# Patient Record
Sex: Male | Born: 1970
Health system: Southern US, Community
[De-identification: ages and names within clinical notes are randomized; demographics above are authoritative.]

## PROBLEM LIST (undated history)

## (undated) DIAGNOSIS — E785 Hyperlipidemia, unspecified: Secondary | ICD-10-CM

## (undated) DIAGNOSIS — T7840XA Allergy, unspecified, initial encounter: Secondary | ICD-10-CM

## (undated) DIAGNOSIS — B019 Varicella without complication: Secondary | ICD-10-CM

## (undated) DIAGNOSIS — J302 Other seasonal allergic rhinitis: Secondary | ICD-10-CM

## (undated) HISTORY — DX: Hyperlipidemia, unspecified: E78.5

## (undated) HISTORY — PX: WISDOM TOOTH EXTRACTION: SHX21

## (undated) HISTORY — PX: FINGER SURGERY: SHX640

## (undated) HISTORY — DX: Allergy, unspecified, initial encounter: T78.40XA

## (undated) HISTORY — DX: Varicella without complication: B01.9

## (undated) HISTORY — PX: TOE SURGERY: SHX1073

## (undated) HISTORY — DX: Other seasonal allergic rhinitis: J30.2

---

## 2003-09-24 ENCOUNTER — Encounter: Admission: RE | Admit: 2003-09-24 | Discharge: 2003-09-24 | Payer: Self-pay | Admitting: Internal Medicine

## 2003-09-26 ENCOUNTER — Ambulatory Visit (HOSPITAL_COMMUNITY): Admission: RE | Admit: 2003-09-26 | Discharge: 2003-09-26 | Payer: Self-pay | Admitting: Internal Medicine

## 2004-04-13 ENCOUNTER — Ambulatory Visit: Payer: Self-pay | Admitting: Internal Medicine

## 2004-08-11 ENCOUNTER — Ambulatory Visit: Payer: Self-pay | Admitting: Internal Medicine

## 2005-08-13 ENCOUNTER — Ambulatory Visit: Payer: Self-pay | Admitting: Internal Medicine

## 2006-05-03 ENCOUNTER — Ambulatory Visit: Payer: Self-pay | Admitting: Internal Medicine

## 2006-07-12 ENCOUNTER — Ambulatory Visit: Payer: Self-pay | Admitting: Internal Medicine

## 2006-07-13 ENCOUNTER — Encounter: Admission: RE | Admit: 2006-07-13 | Discharge: 2006-07-13 | Payer: Self-pay | Admitting: Internal Medicine

## 2008-02-23 HISTORY — PX: BLEPHAROPLASTY: SUR158

## 2011-01-25 ENCOUNTER — Ambulatory Visit: Payer: Self-pay | Admitting: Internal Medicine

## 2011-01-26 ENCOUNTER — Ambulatory Visit (INDEPENDENT_AMBULATORY_CARE_PROVIDER_SITE_OTHER): Payer: Self-pay | Admitting: Internal Medicine

## 2011-01-26 ENCOUNTER — Encounter: Payer: Self-pay | Admitting: Internal Medicine

## 2011-01-26 ENCOUNTER — Other Ambulatory Visit: Payer: Self-pay | Admitting: Internal Medicine

## 2011-01-26 ENCOUNTER — Ambulatory Visit (HOSPITAL_BASED_OUTPATIENT_CLINIC_OR_DEPARTMENT_OTHER)
Admission: RE | Admit: 2011-01-26 | Discharge: 2011-01-26 | Disposition: A | Discharge status: Home / Self Care | Payer: BC Managed Care – PPO | Source: Ambulatory Visit | Attending: Internal Medicine | Admitting: Internal Medicine

## 2011-01-26 ENCOUNTER — Ambulatory Visit (INDEPENDENT_AMBULATORY_CARE_PROVIDER_SITE_OTHER)
Admission: RE | Admit: 2011-01-26 | Discharge: 2011-01-26 | Disposition: A | Discharge status: Home / Self Care | Payer: BC Managed Care – PPO | Source: Ambulatory Visit | Attending: Internal Medicine | Admitting: Internal Medicine

## 2011-01-26 DIAGNOSIS — N5089 Other specified disorders of the male genital organs: Secondary | ICD-10-CM

## 2011-01-26 DIAGNOSIS — R221 Localized swelling, mass and lump, neck: Secondary | ICD-10-CM

## 2011-01-26 DIAGNOSIS — N433 Hydrocele, unspecified: Secondary | ICD-10-CM | POA: Insufficient documentation

## 2011-01-26 DIAGNOSIS — N508 Other specified disorders of male genital organs: Secondary | ICD-10-CM

## 2011-01-26 LAB — BASIC METABOLIC PANEL
BUN: 12 mg/dL (ref 6–23)
CO2: 28 mEq/L (ref 19–32)
Calcium: 9.3 mg/dL (ref 8.4–10.5)
Creat: 0.76 mg/dL (ref 0.50–1.35)
Glucose, Bld: 96 mg/dL (ref 70–99)
Sodium: 139 mEq/L (ref 135–145)

## 2011-01-26 LAB — CBC WITH DIFFERENTIAL/PLATELET
MCHC: 33.6 g/dL (ref 30.0–36.0)
MCV: 89.2 fL (ref 78.0–100.0)
Monocytes Absolute: 0.5 10*3/uL (ref 0.1–1.0)
Neutro Abs: 2.3 10*3/uL (ref 1.7–7.7)
Neutrophils Relative %: 43 % (ref 43–77)
Platelets: 195 10*3/uL (ref 150–400)
RBC: 5.27 MIL/uL (ref 4.22–5.81)
RDW: 12.5 % (ref 11.5–15.5)
WBC: 5.4 10*3/uL (ref 4.0–10.5)

## 2011-01-26 LAB — TSH: TSH: 2.415 u[IU]/mL (ref 0.350–4.500)

## 2011-01-26 MED ORDER — LEVOFLOXACIN 500 MG PO TABS: 500.0000 mg | ORAL_TABLET | Freq: Every day | ORAL | Status: AC

## 2011-01-26 NOTE — Progress Notes (Signed)
  Subjective:    Patient ID: Gary Booth, male    DOB: [birth year], 40 y.o.   MRN: [id number]  HPI Pt presents to clinic for evaluation of scrotal pain and neck mass. Notes left ant neck st mass duration ~33months. No change in size or associated pain. No fever, chills, sweats or weight loss. Also 6 month h/o right testicular pain intermittently for 6 months. Notes on self exam area of prominence along superior aspect of right scrotum. No urinary sx's, dysuria, hematuria or urethral discharge. No alleviating or exacerbating factors. No other complaints.  No past medical history on file. Denies Past Surgical History  Procedure Date  . Blepharoplasty 2010    left eye-lazy eye lid    reports that he has quit smoking. He has quit using smokeless tobacco. His smokeless tobacco use included Chew. He reports that he drinks alcohol. He reports that he does not use illicit drugs. family history includes Arthritis in his mother and paternal grandmother and Prostate cancer in his father. No Known Allergies   Review of Systems  Constitutional: Negative for fever, chills, fatigue and unexpected weight change.  HENT: Negative for neck pain.   Genitourinary: Positive for scrotal swelling and testicular pain. Negative for dysuria, urgency, hematuria and discharge.  All other systems reviewed and are negative.       Objective:   Physical Exam  Nursing note and vitals reviewed. Constitutional: He appears well-developed and well-nourished. No distress.  HENT:  Head: Normocephalic and atraumatic.  Right Ear: External ear normal.  Left Ear: External ear normal.  Nose: Nose normal.  Mouth/Throat: Oropharynx is clear and moist. No oropharyngeal exudate.  Eyes: Conjunctivae are normal. Right eye exhibits no discharge. Left eye exhibits no discharge. No scleral icterus.  Neck: Neck supple.       Left anterior neck- well circumscribed ST mass. Mobile. Nontender. ~1cm  Genitourinary: Penis normal.     Bilaterally descended testes. Testes NT. ST prominence above right testicle  Neurological: He is alert.  Skin: Skin is warm and dry. No rash noted. He is not diaphoretic. No erythema.  Psychiatric: He has a normal mood and affect.          Assessment & Plan:

## 2011-01-27 LAB — URINALYSIS, ROUTINE W REFLEX MICROSCOPIC
Leukocytes, UA: NEGATIVE
Nitrite: NEGATIVE
Protein, ur: NEGATIVE mg/dL
Specific Gravity, Urine: 1.02 (ref 1.005–1.030)
Urobilinogen, UA: 0.2 mg/dL (ref 0.0–1.0)

## 2011-01-27 LAB — SEDIMENTATION RATE: Sed Rate: 1 mm/hr (ref 0–16)

## 2011-01-30 DIAGNOSIS — N5089 Other specified disorders of the male genital organs: Secondary | ICD-10-CM | POA: Insufficient documentation

## 2011-01-30 DIAGNOSIS — R221 Localized swelling, mass and lump, neck: Secondary | ICD-10-CM | POA: Insufficient documentation

## 2011-01-30 NOTE — Assessment & Plan Note (Signed)
Obtain cbc, chem7, esr. Empiric abx trial and close followup. If persists consider ent consult

## 2011-01-30 NOTE — Assessment & Plan Note (Signed)
Obtain ua. Schedule scrotal US

## 2011-02-09 ENCOUNTER — Ambulatory Visit (INDEPENDENT_AMBULATORY_CARE_PROVIDER_SITE_OTHER): Payer: BC Managed Care – PPO | Admitting: Internal Medicine

## 2011-02-09 ENCOUNTER — Encounter: Payer: Self-pay | Admitting: Internal Medicine

## 2011-02-09 VITALS — BP 120/80 | HR 59 | Temp 97.5°F | Resp 16 | Wt 220.0 lb

## 2011-02-09 DIAGNOSIS — N508 Other specified disorders of male genital organs: Secondary | ICD-10-CM

## 2011-02-09 DIAGNOSIS — R221 Localized swelling, mass and lump, neck: Secondary | ICD-10-CM

## 2011-02-09 DIAGNOSIS — R220 Localized swelling, mass and lump, head: Secondary | ICD-10-CM

## 2011-02-09 DIAGNOSIS — N5089 Other specified disorders of the male genital organs: Secondary | ICD-10-CM

## 2011-02-09 NOTE — Progress Notes (Signed)
  Subjective:    Patient ID: Gary Booth, male    DOB: November 11, 1970, 40 y.o.   MRN: [id number]  HPI Pt presents to clinic for followup of multiple medical problems. Previous scrotal tenderness now resolved s/p course of levaquin. Reviewed scrotal US demonstrating bilateral small epididymal cysts and hydroceles. Left anterior neck soft tissue mass remains present without change in size. Area is nontender. Reviewed nl laboratory evaluation.  No alleviating or exacerbating factors. No other complaints.  No past medical history on file. Past Surgical History  Procedure Date  . Blepharoplasty 2010    left eye-lazy eye lid    reports that he has quit smoking. He has quit using smokeless tobacco. His smokeless tobacco use included Chew. He reports that he drinks alcohol. He reports that he does not use illicit drugs. family history includes Arthritis in his mother and paternal grandmother and Prostate cancer in his father. No Known Allergies    Review of Systems see hpi     Objective:   Physical Exam  Nursing note and vitals reviewed. Constitutional: He appears well-developed and well-nourished. No distress.  HENT:  Head: Normocephalic and atraumatic.  Right Ear: External ear normal.  Left Ear: External ear normal.  Eyes: Conjunctivae are normal. No scleral icterus.  Neck: Neck supple.       Left anterior neck lower aspect: ST mass stable without change in size. NT and mobile. ?slightly elongated.  Neurological: He is alert.  Skin: Skin is warm and dry. He is not diaphoretic.  Psychiatric: He has a normal mood and affect.          Assessment & Plan:

## 2011-02-09 NOTE — Assessment & Plan Note (Signed)
Pain resolved s/p abx course. ?epididymitis. Reviewed benign US findings.

## 2011-02-09 NOTE — Assessment & Plan Note (Signed)
Persistent without change s/p abx course. Recommend ENT consult for further evaluation.

## 2013-07-11 ENCOUNTER — Ambulatory Visit (INDEPENDENT_AMBULATORY_CARE_PROVIDER_SITE_OTHER): Payer: Managed Care, Other (non HMO) | Admitting: Physician Assistant

## 2013-07-11 ENCOUNTER — Encounter: Payer: Self-pay | Admitting: Physician Assistant

## 2013-07-11 VITALS — BP 138/82 | HR 66 | Temp 98.8°F | Resp 16 | Ht 69.25 in | Wt 221.5 lb

## 2013-07-11 DIAGNOSIS — J309 Allergic rhinitis, unspecified: Secondary | ICD-10-CM

## 2013-07-11 DIAGNOSIS — Z23 Encounter for immunization: Secondary | ICD-10-CM

## 2013-07-11 DIAGNOSIS — Z0000 Encounter for general adult medical examination without abnormal findings: Secondary | ICD-10-CM

## 2013-07-11 DIAGNOSIS — J302 Other seasonal allergic rhinitis: Secondary | ICD-10-CM | POA: Insufficient documentation

## 2013-07-11 NOTE — Assessment & Plan Note (Signed)
Medical history reviewed and updated.  Physical exam without abnormality. Will obtain fasting labs.  Patient due for TDaP.  Immunization given by nursing staff.

## 2013-07-11 NOTE — Progress Notes (Signed)
Pre visit review using our clinic review tool, if applicable. No additional management support is needed unless otherwise documented below in the visit note/SLS  

## 2013-07-11 NOTE — Progress Notes (Signed)
Patient presents to clinic today for annual exam.  Patient is fasting for labs.  Patient is transferring care from Dr. Charlynn Courthomas [place], as he is no longer here at [facility].  Acute Concerns: Seasonal Allergies -- Claritin daily.    Chronic Issues: Neck Mass -- has been evaluated by ENT.  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   Health Maintenance: Dental -- Up-to-date Vision -- Up-to-date Immunizations -- Due for Tetanus.  Will get today.  Past Medical History  Diagnosis Date  . Chicken pox   . Seasonal allergies     Past Surgical History  Procedure Laterality Date  . Blepharoplasty  2010    left eye-lazy eye lid  . Wisdom tooth extraction    . Toe surgery      Right 2nd - bone spur  . Finger surgery      Dislocation 4th - Left    Current Outpatient Prescriptions on File Prior to Visit  Medication Sig Dispense Refill  . Loratadine (CLARITIN PO) Take 10 mg by mouth as needed (Seasonal Allergies).        No current facility-administered medications on file prior to visit.   No Known Allergies  Family History  Problem Relation Age of Onset  . Arthritis Mother     Living  . Arthritis Paternal Grandmother   . Prostate cancer Father     Living  . Alzheimer's disease Paternal Grandfather   . Deep vein thrombosis Maternal Grandmother   . Stroke Maternal Grandmother   . Irritable bowel syndrome Brother   . Crohn's disease Brother   . Healthy Daughter     History   Social History  . Marital Status: Married    Spouse Name: N/A    Number of Children: N/A  . Years of Education: N/A   Occupational History  . Not on file.   Social History Main Topics  . Smoking status: Former Smoker -- 0.25 packs/day for 1 years    Quit date: 07/11/1988  . Smokeless tobacco: Former NeurosurgeonUser    Types: Chew     Comment: 2. 5 years  . Alcohol Use: Yes     Comment: occasional  . Drug Use: No  . Sexual Activity: Not on file   Other Topics Concern  . Not on file   Social  History Narrative  . No narrative on file   Review of Systems  Constitutional: Negative for fever and weight loss.  HENT: Negative for ear discharge, ear pain, hearing loss and tinnitus.   Eyes: Negative for blurred vision, double vision, photophobia, pain and discharge.  Respiratory: Negative for cough and shortness of breath.   Cardiovascular: Negative for chest pain and palpitations.  Gastrointestinal: Negative for heartburn, nausea, vomiting, abdominal pain, diarrhea, constipation, blood in stool and melena.  Genitourinary: Negative for dysuria, urgency, frequency, hematuria and flank pain.       Nocturia x 0.  Denies concern for ED.  Musculoskeletal: Negative for falls and joint pain.  Skin: Negative for rash.  Neurological: Negative for dizziness, loss of consciousness and headaches.  Endo/Heme/Allergies: Positive for environmental allergies.  Psychiatric/Behavioral: Negative for depression, suicidal ideas, hallucinations and substance abuse. The patient is not nervous/anxious and does not have insomnia.    BP 138/82  Pulse 66  Temp(Src) 98.8 F (37.1 C) (Oral)  Resp 16  Ht 5' 9.25" (1.759 m)  Wt 221 lb 8 oz (100.472 kg)  BMI 32.47 kg/m2  SpO2 97%  Physical Exam  Vitals reviewed. Constitutional: He  is oriented to person, place, and time and well-developed, well-nourished, and in no distress.  HENT:  Head: Normocephalic and atraumatic.  Right Ear: External ear normal.  Left Ear: External ear normal.  Nose: Nose normal.  Mouth/Throat: Oropharynx is clear and moist. No oropharyngeal exudate.  TM within normal limits bilaterally.   Eyes: Conjunctivae and EOM are normal. Pupils are equal, round, and reactive to light.  Neck: Normal range of motion. Neck supple. No thyromegaly present.  Cardiovascular: Normal rate, regular rhythm, normal heart sounds and intact distal pulses.   Pulmonary/Chest: Effort normal and breath sounds normal. No respiratory distress. He has no  wheezes. He has no rales. He exhibits no tenderness.  Abdominal: Soft. Bowel sounds are normal. He exhibits no distension and no mass. There is no tenderness. There is no rebound and no guarding.  Musculoskeletal: Normal range of motion.  Lymphadenopathy:    He has no cervical adenopathy.  Neurological: He is alert and oriented to person, place, and time. No cranial nerve deficit.  Skin: Skin is warm and dry. No rash noted.  Psychiatric: Affect normal.   Assessment/Plan: Seasonal allergies Continue claritin daily.  Visit for preventive health examination Medical history reviewed and updated.  Physical exam without abnormality. Will obtain fasting labs.  Patient due for TDaP.  Immunization given by nursing staff.

## 2013-07-11 NOTE — Assessment & Plan Note (Signed)
Continue claritin daily 

## 2013-07-11 NOTE — Patient Instructions (Signed)
Please return fasting for labs.  I will call you with your results. Please read information below on Preventive Heatlh Care for men.  Please return yearly for annual exam.  Return sooner as needed for acute visits.     Preventive Care for Adults, Male A healthy lifestyle and preventive care can promote health and wellness. Preventive health guidelines for men include the following key practices:  A routine yearly physical is a good way to check with your health care provider about your health and preventative screening. It is a chance to share any concerns and updates on your health and to receive a thorough exam.  Visit your dentist for a routine exam and preventative care every 6 months. Brush your teeth twice a day and floss once a day. Good oral hygiene prevents tooth decay and gum disease.  The frequency of eye exams is based on your age, health, family medical history, use of contact lenses, and other factors. Follow your health care provider's recommendations for frequency of eye exams.  Eat a healthy diet. Foods such as vegetables, fruits, whole grains, low-fat dairy products, and lean protein foods contain the nutrients you need without too many calories. Decrease your intake of foods high in solid fats, added sugars, and salt. Eat the right amount of calories for you.Get information about a proper diet from your health care provider, if necessary.  Regular physical exercise is one of the most important things you can do for your health. Most adults should get at least 150 minutes of moderate-intensity exercise (any activity that increases your heart rate and causes you to sweat) each week. In addition, most adults need muscle-strengthening exercises on 2 or more days a week.  Maintain a healthy weight. The body mass index (BMI) is a screening tool to identify possible weight problems. It provides an estimate of body fat based on height and weight. Your health care provider can find your BMI  and can help you achieve or maintain a healthy weight.For adults 20 years and older:  A BMI below 18.5 is considered underweight.  A BMI of 18.5 to 24.9 is normal.  A BMI of 25 to 29.9 is considered overweight.  A BMI of 30 and above is considered obese.  Maintain normal blood lipids and cholesterol levels by exercising and minimizing your intake of saturated fat. Eat a balanced diet with plenty of fruit and vegetables. Blood tests for lipids and cholesterol should begin at age 34 and be repeated every 5 years. If your lipid or cholesterol levels are high, you are over 50, or you are at high risk for heart disease, you may need your cholesterol levels checked more frequently.Ongoing high lipid and cholesterol levels should be treated with medicines if diet and exercise are not working.  If you smoke, find out from your health care provider how to quit. If you do not use tobacco, do not start.  Lung cancer screening is recommended for adults aged 88 80 years who are at high risk for developing lung cancer because of a history of smoking. A yearly low-dose CT scan of the lungs is recommended for people who have at least a 30-pack-year history of smoking and are a current smoker or have quit within the past 15 years. A pack year of smoking is smoking an average of 1 pack of cigarettes a day for 1 year (for example: 1 pack a day for 30 years or 2 packs a day for 15 years). Yearly screening should continue  until the smoker has stopped smoking for at least 15 years. Yearly screening should be stopped for people who develop a health problem that would prevent them from having lung cancer treatment.  If you choose to drink alcohol, do not have more than 2 drinks per day. One drink is considered to be 12 ounces (355 mL) of beer, 5 ounces (148 mL) of wine, or 1.5 ounces (44 mL) of liquor.  Avoid use of street drugs. Do not share needles with anyone. Ask for help if you need support or instructions about  stopping the use of drugs.  High blood pressure causes heart disease and increases the risk of stroke. Your blood pressure should be checked at least every 1 2 years. Ongoing high blood pressure should be treated with medicines, if weight loss and exercise are not effective.  If you are 79 44 years old, ask your health care provider if you should take aspirin to prevent heart disease.  Diabetes screening involves taking a blood sample to check your fasting blood sugar level. This should be done once every 3 years, after age 66, if you are within normal weight and without risk factors for diabetes. Testing should be considered at a younger age or be carried out more frequently if you are overweight and have at least 1 risk factor for diabetes.  Colorectal cancer can be detected and often prevented. Most routine colorectal cancer screening begins at the age of 29 and continues through age 71. However, your health care provider may recommend screening at an earlier age if you have risk factors for colon cancer. On a yearly basis, your health care provider may provide home test kits to check for hidden blood in the stool. Use of a small camera at the end of a tube to directly examine the colon (sigmoidoscopy or colonoscopy) can detect the earliest forms of colorectal cancer. Talk to your health care provider about this at age 69, when routine screening begins. Direct exam of the colon should be repeated every 5 10 years through age 58, unless early forms of precancerous polyps or small growths are found.  People who are at an increased risk for hepatitis B should be screened for this virus. You are considered at high risk for hepatitis B if:  You were born in a country where hepatitis B occurs often. Talk with your health care provider about which countries are considered high-risk.  Your parents were born in a high-risk country and you have not received a shot to protect against hepatitis B (hepatitis B  vaccine).  You have HIV or AIDS.  You use needles to inject street drugs.  You live with, or have sex with, someone who has hepatitis B.  You are a man who has sex with other men (MSM).  You get hemodialysis treatment.  You take certain medicines for conditions such as cancer, organ transplantation, and autoimmune conditions.  Hepatitis C blood testing is recommended for all people born from [birth year] through 1965 and any individual with known risks for hepatitis C.  Practice safe sex. Use condoms and avoid high-risk sexual practices to reduce the spread of sexually transmitted infections (STIs). STIs include gonorrhea, chlamydia, syphilis, trichomonas, herpes, HPV, and human immunodeficiency virus (HIV). Herpes, HIV, and HPV are viral illnesses that have no cure. They can result in disability, cancer, and death.  A one-time screening for abdominal aortic aneurysm (AAA) and surgical repair of large AAAs by ultrasound are recommended for men ages 47 to 40  years who are current or former smokers.  Healthy men should no longer receive prostate-specific antigen (PSA) blood tests as part of routine cancer screening. Talk with your health care provider about prostate cancer screening.  Testicular cancer screening is not recommended for adult males who have no symptoms. Screening includes self-exam, a health care provider exam, and other screening tests. Consult with your health care provider about any symptoms you have or any concerns you have about testicular cancer.  Use sunscreen. Apply sunscreen liberally and repeatedly throughout the day. You should seek shade when your shadow is shorter than you. Protect yourself by wearing long sleeves, pants, a wide-brimmed hat, and sunglasses year round, whenever you are outdoors.  Once a month, do a whole-body skin exam, using a mirror to look at the skin on your back. Tell your health care provider about new moles, moles that have irregular borders, moles  that are larger than a pencil eraser, or moles that have changed in shape or color.  Stay current with required vaccines (immunizations).  Influenza vaccine. All adults should be immunized every year.  Tetanus, diphtheria, and acellular pertussis (Td, Tdap) vaccine. An adult who has not previously received Tdap or who does not know his vaccine status should receive 1 dose of Tdap. This initial dose should be followed by tetanus and diphtheria toxoids (Td) booster doses every 10 years. Adults with an unknown or incomplete history of completing a 3-dose immunization series with Td-containing vaccines should begin or complete a primary immunization series including a Tdap dose. Adults should receive a Td booster every 10 years.  Varicella vaccine. An adult without evidence of immunity to varicella should receive 2 doses or a second dose if he has previously received 1 dose.  Human papillomavirus (HPV) vaccine. Males aged 31 21 years who have not received the vaccine previously should receive the 3-dose series. Males aged 3 26 years may be immunized. Immunization is recommended through the age of 65 years for any male who has sex with males and did not get any or all doses earlier. Immunization is recommended for any person with an immunocompromised condition through the age of 23 years if he did not get any or all doses earlier. During the 3-dose series, the second dose should be obtained 4 8 weeks after the first dose. The third dose should be obtained 24 weeks after the first dose and 16 weeks after the second dose.  Zoster vaccine. One dose is recommended for adults aged 61 years or older unless certain conditions are present.  Measles, mumps, and rubella (MMR) vaccine. Adults born before [birth year] generally are considered immune to measles and mumps. Adults born in [birth year] or later should have 1 or more doses of MMR vaccine unless there is a contraindication to the vaccine or there is laboratory evidence of  immunity to each of the three diseases. A routine second dose of MMR vaccine should be obtained at least 28 days after the first dose for students attending postsecondary schools, health care workers, or international travelers. People who received inactivated measles vaccine or an unknown type of measles vaccine during 1963 1967 should receive 2 doses of MMR vaccine. People who received inactivated mumps vaccine or an unknown type of mumps vaccine before 1979 and are at high risk for mumps infection should consider immunization with 2 doses of MMR vaccine. Unvaccinated health care workers born before 29 who lack laboratory evidence of measles, mumps, or rubella immunity or laboratory confirmation of disease should consider  measles and mumps immunization with 2 doses of MMR vaccine or rubella immunization with 1 dose of MMR vaccine.  Pneumococcal 13-valent conjugate (PCV13) vaccine. When indicated, a person who is uncertain of his immunization history and has no record of immunization should receive the PCV13 vaccine. An adult aged 70 years or older who has certain medical conditions and has not been previously immunized should receive 1 dose of PCV13 vaccine. This PCV13 should be followed with a dose of pneumococcal polysaccharide (PPSV23) vaccine. The PPSV23 vaccine dose should be obtained at least 8 weeks after the dose of PCV13 vaccine. An adult aged 76 years or older who has certain medical conditions and previously received 1 or more doses of PPSV23 vaccine should receive 1 dose of PCV13. The PCV13 vaccine dose should be obtained 1 or more years after the last PPSV23 vaccine dose.  Pneumococcal polysaccharide (PPSV23) vaccine. When PCV13 is also indicated, PCV13 should be obtained first. All adults aged 24 years and older should be immunized. An adult younger than age 78 years who has certain medical conditions should be immunized. Any person who resides in a nursing home or long-term care facility  should be immunized. An adult smoker should be immunized. People with an immunocompromised condition and certain other conditions should receive both PCV13 and PPSV23 vaccines. People with human immunodeficiency virus (HIV) infection should be immunized as soon as possible after diagnosis. Immunization during chemotherapy or radiation therapy should be avoided. Routine use of PPSV23 vaccine is not recommended for American Indians, [place] Natives, or people younger than 65 years unless there are medical conditions that require PPSV23 vaccine. When indicated, people who have unknown immunization and have no record of immunization should receive PPSV23 vaccine. One-time revaccination 5 years after the first dose of PPSV23 is recommended for people aged 64 64 years who have chronic kidney failure, nephrotic syndrome, asplenia, or immunocompromised conditions. People who received 1 2 doses of PPSV23 before age 48 years should receive another dose of PPSV23 vaccine at age 74 years or later if at least 5 years have passed since the previous dose. Doses of PPSV23 are not needed for people immunized with PPSV23 at or after age 20 years.  Meningococcal vaccine. Adults with asplenia or persistent complement component deficiencies should receive 2 doses of quadrivalent meningococcal conjugate (MenACWY-D) vaccine. The doses should be obtained at least 2 months apart. Microbiologists working with certain meningococcal bacteria, [employer] recruits, people at risk during an outbreak, and people who travel to or live in countries with a high rate of meningitis should be immunized. A first-year college student up through age 16 years who is living in a residence hall should receive a dose if he did not receive a dose on or after his 16th birthday. Adults who have certain high-risk conditions should receive one or more doses of vaccine.  Hepatitis A vaccine. Adults who wish to be protected from this disease, have certain high-risk  conditions, work with hepatitis A-infected animals, work in hepatitis A research labs, or travel to or work in countries with a high rate of hepatitis A should be immunized. Adults who were previously unvaccinated and who anticipate close contact with an international adoptee during the first 60 days after arrival in the [place] from a country with a high rate of hepatitis A should be immunized.  Hepatitis B vaccine. Adults who wish to be protected from this disease, have certain high-risk conditions, may be exposed to blood or other infectious body fluids, are household  contacts or sex partners of hepatitis B positive people, are clients or workers in certain care facilities, or travel to or work in countries with a high rate of hepatitis B should be immunized.  Haemophilus influenzae type b (Hib) vaccine. A previously unvaccinated person with asplenia or sickle cell disease or having a scheduled splenectomy should receive 1 dose of Hib vaccine. Regardless of previous immunization, a recipient of a hematopoietic stem cell transplant should receive a 3-dose series 6 12 months after his successful transplant. Hib vaccine is not recommended for adults with HIV infection. Preventive Service / Frequency Ages 62 to 62  Blood pressure check.** / Every 1 to 2 years.  Lipid and cholesterol check.** / Every 5 years beginning at age 57.  Hepatitis C blood test.** / For any individual with known risks for hepatitis C.  Skin self-exam. / Monthly.  Influenza vaccine. / Every year.  Tetanus, diphtheria, and acellular pertussis (Tdap, Td) vaccine.** / Consult your health care provider. 1 dose of Td every 10 years.  Varicella vaccine.** / Consult your health care provider.  HPV vaccine. / 3 doses over 6 months, if 34 or younger.  Measles, mumps, rubella (MMR) vaccine.** / You need at least 1 dose of MMR if you were born in 1957 or later. You may also need a second dose.  Pneumococcal 13-valent  conjugate (PCV13) vaccine.** / Consult your health care provider.  Pneumococcal polysaccharide (PPSV23) vaccine.** / 1 to 2 doses if you smoke cigarettes or if you have certain conditions.  Meningococcal vaccine.** / 1 dose if you are age 79 to 16 years and a Market researcher living in a residence hall, or have one of several medical conditions. You may also need additional booster doses.  Hepatitis A vaccine.** / Consult your health care provider.  Hepatitis B vaccine.** / Consult your health care provider.  Haemophilus influenzae type b (Hib) vaccine.** / Consult your health care provider. Ages 73 to 39  Blood pressure check.** / Every 1 to 2 years.  Lipid and cholesterol check.** / Every 5 years beginning at age 4.  Lung cancer screening. / Every year if you are aged 52 80 years and have a 30-pack-year history of smoking and currently smoke or have quit within the past 15 years. Yearly screening is stopped once you have quit smoking for at least 15 years or develop a health problem that would prevent you from having lung cancer treatment.  Fecal occult blood test (FOBT) of stool. / Every year beginning at age 63 and continuing until age 69. You may not have to do this test if you get a colonoscopy every 10 years.  Flexible sigmoidoscopy** or colonoscopy.** / Every 5 years for a flexible sigmoidoscopy or every 10 years for a colonoscopy beginning at age 60 and continuing until age 5.  Hepatitis C blood test.** / For all people born from [birth year] through 1966 and any individual with known risks for hepatitis C.  Skin self-exam. / Monthly.  Influenza vaccine. / Every year.  Tetanus, diphtheria, and acellular pertussis (Tdap/Td) vaccine.** / Consult your health care provider. 1 dose of Td every 10 years.  Varicella vaccine.** / Consult your health care provider.  Zoster vaccine.** / 1 dose for adults aged 51 years or older.  Measles, mumps, rubella (MMR) vaccine.** / You  need at least 1 dose of MMR if you were born in 1957 or later. You may also need a second dose.  Pneumococcal 13-valent conjugate (PCV13) vaccine.** / Consult  your health care provider.  Pneumococcal polysaccharide (PPSV23) vaccine.** / 1 to 2 doses if you smoke cigarettes or if you have certain conditions.  Meningococcal vaccine.** / Consult your health care provider.  Hepatitis A vaccine.** / Consult your health care provider.  Hepatitis B vaccine.** / Consult your health care provider.  Haemophilus influenzae type b (Hib) vaccine.** / Consult your health care provider. Ages 74 and over  Blood pressure check.** / Every 1 to 2 years.  Lipid and cholesterol check.**/ Every 5 years beginning at age 62.  Lung cancer screening. / Every year if you are aged 72 80 years and have a 30-pack-year history of smoking and currently smoke or have quit within the past 15 years. Yearly screening is stopped once you have quit smoking for at least 15 years or develop a health problem that would prevent you from having lung cancer treatment.  Fecal occult blood test (FOBT) of stool. / Every year beginning at age 69 and continuing until age 100. You may not have to do this test if you get a colonoscopy every 10 years.  Flexible sigmoidoscopy** or colonoscopy.** / Every 5 years for a flexible sigmoidoscopy or every 10 years for a colonoscopy beginning at age 42 and continuing until age 21.  Hepatitis C blood test.** / For all people born from [birth year] through 1966 and any individual with known risks for hepatitis C.  Abdominal aortic aneurysm (AAA) screening.** / A one-time screening for ages 59 to 77 years who are current or former smokers.  Skin self-exam. / Monthly.  Influenza vaccine. / Every year.  Tetanus, diphtheria, and acellular pertussis (Tdap/Td) vaccine.** / 1 dose of Td every 10 years.  Varicella vaccine.** / Consult your health care provider.  Zoster vaccine.** / 1 dose for adults aged 28  years or older.  Pneumococcal 13-valent conjugate (PCV13) vaccine.** / Consult your health care provider.  Pneumococcal polysaccharide (PPSV23) vaccine.** / 1 dose for all adults aged 34 years and older.  Meningococcal vaccine.** / Consult your health care provider.  Hepatitis A vaccine.** / Consult your health care provider.  Hepatitis B vaccine.** / Consult your health care provider.  Haemophilus influenzae type b (Hib) vaccine.** / Consult your health care provider. **Family history and personal history of risk and conditions may change your health care provider's recommendations. Document Released: 04/06/2001 Document Revised: 11/29/2012 Document Reviewed: 07/06/2010 [facility] Patient Information 2014 [place], [place].

## 2013-07-11 NOTE — Addendum Note (Signed)
Addended by: Regis BillSCATES, SHARON L on: 07/11/2013 06:06 PM   Modules accepted: Orders

## 2013-09-21 ENCOUNTER — Encounter: Payer: Managed Care, Other (non HMO) | Admitting: Internal Medicine

## 2014-07-15 ENCOUNTER — Encounter: Payer: Managed Care, Other (non HMO) | Admitting: Physician Assistant

## 2014-07-18 ENCOUNTER — Telehealth: Payer: Self-pay | Admitting: *Deleted

## 2014-07-18 NOTE — Telephone Encounter (Signed)
Unable to reach patient at time of Pre-Visit Call.  Left message for patient to return call when available.    

## 2014-07-19 ENCOUNTER — Encounter: Payer: Self-pay | Admitting: Physician Assistant

## 2014-07-19 ENCOUNTER — Ambulatory Visit (INDEPENDENT_AMBULATORY_CARE_PROVIDER_SITE_OTHER): Payer: Managed Care, Other (non HMO) | Admitting: Physician Assistant

## 2014-07-19 VITALS — BP 146/95 | HR 74 | Temp 97.8°F | Ht 70.0 in | Wt 223.4 lb

## 2014-07-19 DIAGNOSIS — J302 Other seasonal allergic rhinitis: Secondary | ICD-10-CM | POA: Diagnosis not present

## 2014-07-19 DIAGNOSIS — Z0000 Encounter for general adult medical examination without abnormal findings: Secondary | ICD-10-CM

## 2014-07-19 DIAGNOSIS — Z8042 Family history of malignant neoplasm of prostate: Secondary | ICD-10-CM | POA: Diagnosis not present

## 2014-07-19 LAB — URINALYSIS, ROUTINE W REFLEX MICROSCOPIC
BILIRUBIN URINE: NEGATIVE
HGB URINE DIPSTICK: NEGATIVE
Ketones, ur: NEGATIVE
LEUKOCYTES UA: NEGATIVE
Nitrite: NEGATIVE
Specific Gravity, Urine: 1.02 (ref 1.000–1.030)
TOTAL PROTEIN, URINE-UPE24: NEGATIVE
UROBILINOGEN UA: 0.2 (ref 0.0–1.0)
Urine Glucose: NEGATIVE
pH: 6 (ref 5.0–8.0)

## 2014-07-19 LAB — CBC
HEMATOCRIT: 43.9 % (ref 39.0–52.0)
HEMOGLOBIN: 15.1 g/dL (ref 13.0–17.0)
MCHC: 34.5 g/dL (ref 30.0–36.0)
MCV: 89.5 fl (ref 78.0–100.0)
PLATELETS: 273 10*3/uL (ref 150.0–400.0)
RBC: 4.9 Mil/uL (ref 4.22–5.81)
RDW: 13.1 % (ref 11.5–15.5)
WBC: 6.6 10*3/uL (ref 4.0–10.5)

## 2014-07-19 LAB — HEPATIC FUNCTION PANEL
ALK PHOS: 101 U/L (ref 39–117)
ALT: 75 U/L — ABNORMAL HIGH (ref 0–53)
AST: 68 U/L — AB (ref 0–37)
Albumin: 4.2 g/dL (ref 3.5–5.2)
BILIRUBIN DIRECT: 0.1 mg/dL (ref 0.0–0.3)
BILIRUBIN TOTAL: 0.5 mg/dL (ref 0.2–1.2)
Total Protein: 6.5 g/dL (ref 6.0–8.3)

## 2014-07-19 LAB — BASIC METABOLIC PANEL
BUN: 13 mg/dL (ref 6–23)
CALCIUM: 8.7 mg/dL (ref 8.4–10.5)
CO2: 26 mEq/L (ref 19–32)
CREATININE: 0.74 mg/dL (ref 0.40–1.50)
Chloride: 107 mEq/L (ref 96–112)
GFR: 121.52 mL/min (ref >60.00)
Glucose, Bld: 122 mg/dL — ABNORMAL HIGH (ref 70–99)
Potassium: 3.9 mEq/L (ref 3.5–5.1)
SODIUM: 139 meq/L (ref 135–145)

## 2014-07-19 LAB — LIPID PANEL
CHOLESTEROL: 119 mg/dL (ref 0–200)
HDL: 36.7 mg/dL — AB (ref >39.00)
LDL CALC: 66 mg/dL (ref 0–99)
NonHDL: 82.3
TRIGLYCERIDES: 81 mg/dL (ref 0.0–149.0)
Total CHOL/HDL Ratio: 3
VLDL: 16.2 mg/dL (ref 0.0–40.0)

## 2014-07-19 LAB — PSA: PSA: 0.36 ng/mL (ref 0.10–4.00)

## 2014-07-19 LAB — HEMOGLOBIN A1C: HEMOGLOBIN A1C: 5.8 % (ref 4.6–6.5)

## 2014-07-19 LAB — TSH: TSH: 1.75 u[IU]/mL (ref 0.35–4.50)

## 2014-07-19 NOTE — Patient Instructions (Signed)
Please go to the lab for blood work.  I will call you with your results. If all looks good we will follow-up in 1 year for your physical.  If anything is abnormal we will treat accordingly and get you back into the office for a follow-up visit.  Preventive Care for Adults A healthy lifestyle and preventive care can promote health and wellness. Preventive health guidelines for men include the following key practices:  A routine yearly physical is a good way to check with your health care provider about your health and preventative screening. It is a chance to share any concerns and updates on your health and to receive a thorough exam.  Visit your dentist for a routine exam and preventative care every 6 months. Brush your teeth twice a day and floss once a day. Good oral hygiene prevents tooth decay and gum disease.  The frequency of eye exams is based on your age, health, family medical history, use of contact lenses, and other factors. Follow your health care provider's recommendations for frequency of eye exams.  Eat a healthy diet. Foods such as vegetables, fruits, whole grains, low-fat dairy products, and lean protein foods contain the nutrients you need without too many calories. Decrease your intake of foods high in solid fats, added sugars, and salt. Eat the right amount of calories for you.Get information about a proper diet from your health care provider, if necessary.  Regular physical exercise is one of the most important things you can do for your health. Most adults should get at least 150 minutes of moderate-intensity exercise (any activity that increases your heart rate and causes you to sweat) each week. In addition, most adults need muscle-strengthening exercises on 2 or more days a week.  Maintain a healthy weight. The body mass index (BMI) is a screening tool to identify possible weight problems. It provides an estimate of body fat based on height and weight. Your health care  provider can find your BMI and can help you achieve or maintain a healthy weight.For adults 20 years and older:  A BMI below 18.5 is considered underweight.  A BMI of 18.5 to 24.9 is normal.  A BMI of 25 to 29.9 is considered overweight.  A BMI of 30 and above is considered obese.  Maintain normal blood lipids and cholesterol levels by exercising and minimizing your intake of saturated fat. Eat a balanced diet with plenty of fruit and vegetables. Blood tests for lipids and cholesterol should begin at age 34 and be repeated every 5 years. If your lipid or cholesterol levels are high, you are over 50, or you are at high risk for heart disease, you may need your cholesterol levels checked more frequently.Ongoing high lipid and cholesterol levels should be treated with medicines if diet and exercise are not working.  If you smoke, find out from your health care provider how to quit. If you do not use tobacco, do not start.  Lung cancer screening is recommended for adults aged 37-80 years who are at high risk for developing lung cancer because of a history of smoking. A yearly low-dose CT scan of the lungs is recommended for people who have at least a 30-pack-year history of smoking and are a current smoker or have quit within the past 15 years. A pack year of smoking is smoking an average of 1 pack of cigarettes a day for 1 year (for example: 1 pack a day for 30 years or 2 packs a day for  15 years). Yearly screening should continue until the smoker has stopped smoking for at least 15 years. Yearly screening should be stopped for people who develop a health problem that would prevent them from having lung cancer treatment.  If you choose to drink alcohol, do not have more than 2 drinks per day. One drink is considered to be 12 ounces (355 mL) of beer, 5 ounces (148 mL) of wine, or 1.5 ounces (44 mL) of liquor.  Avoid use of street drugs. Do not share needles with anyone. Ask for help if you need  support or instructions about stopping the use of drugs.  High blood pressure causes heart disease and increases the risk of stroke. Your blood pressure should be checked at least every 1-2 years. Ongoing high blood pressure should be treated with medicines, if weight loss and exercise are not effective.  If you are 73-64 years old, ask your health care provider if you should take aspirin to prevent heart disease.  Diabetes screening involves taking a blood sample to check your fasting blood sugar level. This should be done once every 3 years, after age 73, if you are within normal weight and without risk factors for diabetes. Testing should be considered at a younger age or be carried out more frequently if you are overweight and have at least 1 risk factor for diabetes.  Colorectal cancer can be detected and often prevented. Most routine colorectal cancer screening begins at the age of 28 and continues through age 63. However, your health care provider may recommend screening at an earlier age if you have risk factors for colon cancer. On a yearly basis, your health care provider may provide home test kits to check for hidden blood in the stool. Use of a small camera at the end of a tube to directly examine the colon (sigmoidoscopy or colonoscopy) can detect the earliest forms of colorectal cancer. Talk to your health care provider about this at age 46, when routine screening begins. Direct exam of the colon should be repeated every 5-10 years through age 81, unless early forms of precancerous polyps or small growths are found.  People who are at an increased risk for hepatitis B should be screened for this virus. You are considered at high risk for hepatitis B if:  You were born in a country where hepatitis B occurs often. Talk with your health care provider about which countries are considered high risk.  Your parents were born in a high-risk country and you have not received a shot to protect  against hepatitis B (hepatitis B vaccine).  You have HIV or AIDS.  You use needles to inject street drugs.  You live with, or have sex with, someone who has hepatitis B.  You are a man who has sex with other men (MSM).  You get hemodialysis treatment.  You take certain medicines for conditions such as cancer, organ transplantation, and autoimmune conditions.  Hepatitis C blood testing is recommended for all people born from [birth year] through 1965 and any individual with known risks for hepatitis C.  Practice safe sex. Use condoms and avoid high-risk sexual practices to reduce the spread of sexually transmitted infections (STIs). STIs include gonorrhea, chlamydia, syphilis, trichomonas, herpes, HPV, and human immunodeficiency virus (HIV). Herpes, HIV, and HPV are viral illnesses that have no cure. They can result in disability, cancer, and death.  If you are at risk of being infected with HIV, it is recommended that you take a prescription medicine daily  to prevent HIV infection. This is called preexposure prophylaxis (PrEP). You are considered at risk if:  You are a man who has sex with other men (MSM) and have other risk factors.  You are a heterosexual man, are sexually active, and are at increased risk for HIV infection.  You take drugs by injection.  You are sexually active with a partner who has HIV.  Talk with your health care provider about whether you are at high risk of being infected with HIV. If you choose to begin PrEP, you should first be tested for HIV. You should then be tested every 3 months for as long as you are taking PrEP.  A one-time screening for abdominal aortic aneurysm (AAA) and surgical repair of large AAAs by ultrasound are recommended for men ages 56 to 72 years who are current or former smokers.  Healthy men should no longer receive prostate-specific antigen (PSA) blood tests as part of routine cancer screening. Talk with your health care provider about  prostate cancer screening.  Testicular cancer screening is not recommended for adult males who have no symptoms. Screening includes self-exam, a health care provider exam, and other screening tests. Consult with your health care provider about any symptoms you have or any concerns you have about testicular cancer.  Use sunscreen. Apply sunscreen liberally and repeatedly throughout the day. You should seek shade when your shadow is shorter than you. Protect yourself by wearing long sleeves, pants, a wide-brimmed hat, and sunglasses year round, whenever you are outdoors.  Once a month, do a whole-body skin exam, using a mirror to look at the skin on your back. Tell your health care provider about new moles, moles that have irregular borders, moles that are larger than a pencil eraser, or moles that have changed in shape or color.  Stay current with required vaccines (immunizations).  Influenza vaccine. All adults should be immunized every year.  Tetanus, diphtheria, and acellular pertussis (Td, Tdap) vaccine. An adult who has not previously received Tdap or who does not know his vaccine status should receive 1 dose of Tdap. This initial dose should be followed by tetanus and diphtheria toxoids (Td) booster doses every 10 years. Adults with an unknown or incomplete history of completing a 3-dose immunization series with Td-containing vaccines should begin or complete a primary immunization series including a Tdap dose. Adults should receive a Td booster every 10 years.  Varicella vaccine. An adult without evidence of immunity to varicella should receive 2 doses or a second dose if he has previously received 1 dose.  Human papillomavirus (HPV) vaccine. Males aged 63-21 years who have not received the vaccine previously should receive the 3-dose series. Males aged 22-26 years may be immunized. Immunization is recommended through the age of 52 years for any male who has sex with males and did not get any  or all doses earlier. Immunization is recommended for any person with an immunocompromised condition through the age of 83 years if he did not get any or all doses earlier. During the 3-dose series, the second dose should be obtained 4-8 weeks after the first dose. The third dose should be obtained 24 weeks after the first dose and 16 weeks after the second dose.  Zoster vaccine. One dose is recommended for adults aged 64 years or older unless certain conditions are present.  Measles, mumps, and rubella (MMR) vaccine. Adults born before [birth year] generally are considered immune to measles and mumps. Adults born in [birth year] or later should  have 1 or more doses of MMR vaccine unless there is a contraindication to the vaccine or there is laboratory evidence of immunity to each of the three diseases. A routine second dose of MMR vaccine should be obtained at least 28 days after the first dose for students attending postsecondary schools, health care workers, or international travelers. People who received inactivated measles vaccine or an unknown type of measles vaccine during 1963-1967 should receive 2 doses of MMR vaccine. People who received inactivated mumps vaccine or an unknown type of mumps vaccine before 1979 and are at high risk for mumps infection should consider immunization with 2 doses of MMR vaccine. Unvaccinated health care workers born before [birth year] who lack laboratory evidence of measles, mumps, or rubella immunity or laboratory confirmation of disease should consider measles and mumps immunization with 2 doses of MMR vaccine or rubella immunization with 1 dose of MMR vaccine.  Pneumococcal 13-valent conjugate (PCV13) vaccine. When indicated, a person who is uncertain of his immunization history and has no record of immunization should receive the PCV13 vaccine. An adult aged 3 years or older who has certain medical conditions and has not been previously immunized should receive 1 dose of PCV13 vaccine.  This PCV13 should be followed with a dose of pneumococcal polysaccharide (PPSV23) vaccine. The PPSV23 vaccine dose should be obtained at least 8 weeks after the dose of PCV13 vaccine. An adult aged 20 years or older who has certain medical conditions and previously received 1 or more doses of PPSV23 vaccine should receive 1 dose of PCV13. The PCV13 vaccine dose should be obtained 1 or more years after the last PPSV23 vaccine dose.  Pneumococcal polysaccharide (PPSV23) vaccine. When PCV13 is also indicated, PCV13 should be obtained first. All adults aged 45 years and older should be immunized. An adult younger than age 64 years who has certain medical conditions should be immunized. Any person who resides in a nursing home or long-term care facility should be immunized. An adult smoker should be immunized. People with an immunocompromised condition and certain other conditions should receive both PCV13 and PPSV23 vaccines. People with human immunodeficiency virus (HIV) infection should be immunized as soon as possible after diagnosis. Immunization during chemotherapy or radiation therapy should be avoided. Routine use of PPSV23 vaccine is not recommended for American Indians, [place] Natives, or people younger than 65 years unless there are medical conditions that require PPSV23 vaccine. When indicated, people who have unknown immunization and have no record of immunization should receive PPSV23 vaccine. One-time revaccination 5 years after the first dose of PPSV23 is recommended for people aged 19-64 years who have chronic kidney failure, nephrotic syndrome, asplenia, or immunocompromised conditions. People who received 1-2 doses of PPSV23 before age 72 years should receive another dose of PPSV23 vaccine at age 89 years or later if at least 5 years have passed since the previous dose. Doses of PPSV23 are not needed for people immunized with PPSV23 at or after age 39 years.  Meningococcal vaccine. Adults with  asplenia or persistent complement component deficiencies should receive 2 doses of quadrivalent meningococcal conjugate (MenACWY-D) vaccine. The doses should be obtained at least 2 months apart. Microbiologists working with certain meningococcal bacteria, [employer] recruits, people at risk during an outbreak, and people who travel to or live in countries with a high rate of meningitis should be immunized. A first-year college student up through age 2 years who is living in a residence hall should receive a dose if he did not receive  a dose on or after his 16th birthday. Adults who have certain high-risk conditions should receive one or more doses of vaccine.  Hepatitis A vaccine. Adults who wish to be protected from this disease, have certain high-risk conditions, work with hepatitis A-infected animals, work in hepatitis A research labs, or travel to or work in countries with a high rate of hepatitis A should be immunized. Adults who were previously unvaccinated and who anticipate close contact with an international adoptee during the first 60 days after arrival in the [place] from a country with a high rate of hepatitis A should be immunized.  Hepatitis B vaccine. Adults should be immunized if they wish to be protected from this disease, have certain high-risk conditions, may be exposed to blood or other infectious body fluids, are household contacts or sex partners of hepatitis B positive people, are clients or workers in certain care facilities, or travel to or work in countries with a high rate of hepatitis B.  Haemophilus influenzae type b (Hib) vaccine. A previously unvaccinated person with asplenia or sickle cell disease or having a scheduled splenectomy should receive 1 dose of Hib vaccine. Regardless of previous immunization, a recipient of a hematopoietic stem cell transplant should receive a 3-dose series 6-12 months after his successful transplant. Hib vaccine is not recommended for adults  with HIV infection. Preventive Service / Frequency Ages 14 to 14  Blood pressure check.** / Every 1 to 2 years.  Lipid and cholesterol check.** / Every 5 years beginning at age 84.  Hepatitis C blood test.** / For any individual with known risks for hepatitis C.  Skin self-exam. / Monthly.  Influenza vaccine. / Every year.  Tetanus, diphtheria, and acellular pertussis (Tdap, Td) vaccine.** / Consult your health care provider. 1 dose of Td every 10 years.  Varicella vaccine.** / Consult your health care provider.  HPV vaccine. / 3 doses over 6 months, if 63 or younger.  Measles, mumps, rubella (MMR) vaccine.** / You need at least 1 dose of MMR if you were born in 1957 or later. You may also need a second dose.  Pneumococcal 13-valent conjugate (PCV13) vaccine.** / Consult your health care provider.  Pneumococcal polysaccharide (PPSV23) vaccine.** / 1 to 2 doses if you smoke cigarettes or if you have certain conditions.  Meningococcal vaccine.** / 1 dose if you are age 62 to 93 years and a Market researcher living in a residence hall, or have one of several medical conditions. You may also need additional booster doses.  Hepatitis A vaccine.** / Consult your health care provider.  Hepatitis B vaccine.** / Consult your health care provider.  Haemophilus influenzae type b (Hib) vaccine.** / Consult your health care provider. Ages 39 to 57  Blood pressure check.** / Every 1 to 2 years.  Lipid and cholesterol check.** / Every 5 years beginning at age 71.  Lung cancer screening. / Every year if you are aged 27-80 years and have a 30-pack-year history of smoking and currently smoke or have quit within the past 15 years. Yearly screening is stopped once you have quit smoking for at least 15 years or develop a health problem that would prevent you from having lung cancer treatment.  Fecal occult blood test (FOBT) of stool. / Every year beginning at age 20 and continuing until  age 70. You may not have to do this test if you get a colonoscopy every 10 years.  Flexible sigmoidoscopy** or colonoscopy.** / Every 5 years for a  flexible sigmoidoscopy or every 10 years for a colonoscopy beginning at age 54 and continuing until age 22.  Hepatitis C blood test.** / For all people born from [birth year] through 1965 and any individual with known risks for hepatitis C.  Skin self-exam. / Monthly.  Influenza vaccine. / Every year.  Tetanus, diphtheria, and acellular pertussis (Tdap/Td) vaccine.** / Consult your health care provider. 1 dose of Td every 10 years.  Varicella vaccine.** / Consult your health care provider.  Zoster vaccine.** / 1 dose for adults aged 72 years or older.  Measles, mumps, rubella (MMR) vaccine.** / You need at least 1 dose of MMR if you were born in 1957 or later. You may also need a second dose.  Pneumococcal 13-valent conjugate (PCV13) vaccine.** / Consult your health care provider.  Pneumococcal polysaccharide (PPSV23) vaccine.** / 1 to 2 doses if you smoke cigarettes or if you have certain conditions.  Meningococcal vaccine.** / Consult your health care provider.  Hepatitis A vaccine.** / Consult your health care provider.  Hepatitis B vaccine.** / Consult your health care provider.  Haemophilus influenzae type b (Hib) vaccine.** / Consult your health care provider. Ages 65 and over  Blood pressure check.** / Every 1 to 2 years.  Lipid and cholesterol check.**/ Every 5 years beginning at age 52.  Lung cancer screening. / Every year if you are aged 40-80 years and have a 30-pack-year history of smoking and currently smoke or have quit within the past 15 years. Yearly screening is stopped once you have quit smoking for at least 15 years or develop a health problem that would prevent you from having lung cancer treatment.  Fecal occult blood test (FOBT) of stool. / Every year beginning at age 61 and continuing until age 42. You may not have to  do this test if you get a colonoscopy every 10 years.  Flexible sigmoidoscopy** or colonoscopy.** / Every 5 years for a flexible sigmoidoscopy or every 10 years for a colonoscopy beginning at age 73 and continuing until age 42.  Hepatitis C blood test.** / For all people born from [birth year] through 1965 and any individual with known risks for hepatitis C.  Abdominal aortic aneurysm (AAA) screening.** / A one-time screening for ages 64 to 15 years who are current or former smokers.  Skin self-exam. / Monthly.  Influenza vaccine. / Every year.  Tetanus, diphtheria, and acellular pertussis (Tdap/Td) vaccine.** / 1 dose of Td every 10 years.  Varicella vaccine.** / Consult your health care provider.  Zoster vaccine.** / 1 dose for adults aged 84 years or older.  Pneumococcal 13-valent conjugate (PCV13) vaccine.** / Consult your health care provider.  Pneumococcal polysaccharide (PPSV23) vaccine.** / 1 dose for all adults aged 14 years and older.  Meningococcal vaccine.** / Consult your health care provider.  Hepatitis A vaccine.** / Consult your health care provider.  Hepatitis B vaccine.** / Consult your health care provider.  Haemophilus influenzae type b (Hib) vaccine.** / Consult your health care provider. **Family history and personal history of risk and conditions may change your health care provider's recommendations. Document Released: 04/06/2001 Document Revised: 02/13/2013 Document Reviewed: 07/06/2010 [facility] Patient Information 2015 [place], [place]. This information is not intended to replace advice given to you by your health care provider. Make sure you discuss any questions you have with your health care provider.

## 2014-07-19 NOTE — Assessment & Plan Note (Signed)
Well controlled. No changes. 

## 2014-07-19 NOTE — Progress Notes (Signed)
Pre visit review using our clinic review tool, if applicable. No additional management support is needed unless otherwise documented below in the visit note. 

## 2014-07-19 NOTE — Assessment & Plan Note (Signed)
Asymptomatic.  Will check PSA today after long discussion of risks and benefits of screening.

## 2014-07-19 NOTE — Progress Notes (Signed)
Patient presents to clinic today for annual exam.  Patient is fasting for labs.  Acute Concerns: No acute concerns today.  Chronic Issues: Seasonal Allergies -- still well-controlled with Claritin PRN.  No breakthrough symptoms.  Health Maintenance: Dental -- up-to-date Vision -- up-to-date Immunizations -- Tetanus up-to-date  Past Medical History  Diagnosis Date  . Chicken pox   . Seasonal allergies     Past Surgical History  Procedure Laterality Date  . Blepharoplasty  2010    left eye-lazy eye lid  . Wisdom tooth extraction    . Toe surgery      Right 2nd - bone spur  . Finger surgery      Dislocation 4th - Left    Current Outpatient Prescriptions on File Prior to Visit  Medication Sig Dispense Refill  . Loratadine (CLARITIN PO) Take 10 mg by mouth as needed (Seasonal Allergies).      No current facility-administered medications on file prior to visit.    No Known Allergies  Family History  Problem Relation Age of Onset  . Arthritis Mother     Living  . Arthritis Paternal Grandmother   . Prostate cancer Father     Living  . Alzheimer's disease Paternal Grandfather   . Deep vein thrombosis Maternal Grandmother   . Stroke Maternal Grandmother   . Irritable bowel syndrome Brother   . Crohn's disease Brother   . Healthy Daughter     History   Social History  . Marital Status: Married    Spouse Name: N/A  . Number of Children: N/A  . Years of Education: N/A   Occupational History  . Not on file.   Social History Main Topics  . Smoking status: Former Smoker -- 0.25 packs/day for 1 years    Quit date: 07/11/1988  . Smokeless tobacco: Former Neurosurgeon    Types: Chew     Comment: 2. 5 years  . Alcohol Use: 0.0 oz/week    0 Standard drinks or equivalent per week     Comment: occasional  . Drug Use: No  . Sexual Activity: Not on file   Other Topics Concern  . Not on file   Social History Narrative   Review of Systems  Constitutional:  Negative for fever and weight loss.  HENT: Negative for ear discharge, ear pain, hearing loss and tinnitus.   Eyes: Negative for blurred vision, double vision, photophobia and pain.  Respiratory: Negative for cough and shortness of breath.   Cardiovascular: Negative for chest pain and palpitations.  Gastrointestinal: Negative for heartburn, nausea, vomiting, abdominal pain, diarrhea, constipation, blood in stool and melena.  Genitourinary: Negative for dysuria, urgency, frequency, hematuria and flank pain.  Musculoskeletal: Negative for falls.  Neurological: Negative for dizziness, loss of consciousness and headaches.  Endo/Heme/Allergies: Negative for environmental allergies.  Psychiatric/Behavioral: Negative for depression, suicidal ideas, hallucinations and substance abuse. The patient is not nervous/anxious and does not have insomnia.     BP 146/95 mmHg  Pulse 74  Temp(Src) 97.8 F (36.6 C) (Oral)  Ht  (1.778 m)  Wt 223 lb 6.4 oz (101.334 kg)  BMI 32.05 kg/m2  SpO2 97%  Physical Exam  Constitutional: He is oriented to person, place, and time and well-developed, well-nourished, and in no distress.  HENT:  Head: Normocephalic and atraumatic.  Right Ear: External ear normal.  Left Ear: External ear normal.  Nose: Nose normal.  Mouth/Throat: Oropharynx is clear and moist. No oropharyngeal exudate.  Eyes: Conjunctivae and EOM  are normal. Pupils are equal, round, and reactive to light.  Neck: Neck supple. No thyromegaly present.  Cardiovascular: Normal rate, regular rhythm, normal heart sounds and intact distal pulses.   Pulmonary/Chest: Effort normal and breath sounds normal. No respiratory distress. He has no wheezes. He has no rales. He exhibits no tenderness.  Abdominal: Soft. Bowel sounds are normal. He exhibits no distension and no mass. There is no tenderness. There is no rebound and no guarding.  Genitourinary: Testes/scrotum normal.  Patient defers.  Lymphadenopathy:     He has no cervical adenopathy.  Neurological: He is alert and oriented to person, place, and time.  Skin: Skin is warm and dry. No rash noted.  Psychiatric: Affect normal.  Vitals reviewed.  Assessment/Plan: Family history of prostate cancer Asymptomatic.  Will check PSA today after long discussion of risks and benefits of screening.   Seasonal allergies Well-controlled. No changes.   Visit for preventive health examination Depression screen negative. Health Maintenance reviewed with patient.  Up-to-date on immunizations.  Will check fasting labs today.  Preventive care schedule given in AVS.  Follow-up will be based on results.

## 2014-07-19 NOTE — Assessment & Plan Note (Signed)
Depression screen negative. Health Maintenance reviewed with patient.  Up-to-date on immunizations.  Will check fasting labs today.  Preventive care schedule given in AVS.  Follow-up will be based on results.

## 2014-08-06 ENCOUNTER — Encounter: Payer: Self-pay | Admitting: *Deleted

## 2014-08-09 ENCOUNTER — Telehealth: Payer: Self-pay | Admitting: *Deleted

## 2014-08-09 DIAGNOSIS — R748 Abnormal levels of other serum enzymes: Secondary | ICD-10-CM

## 2014-08-09 NOTE — Telephone Encounter (Signed)
Spoke with patient via telephone RE: lab resutls and further provider instructions; mailed copy of results at pt request; future lab order placed & lab appt scheduled for 06/24/16at 9:00a/SLS

## 2014-08-16 ENCOUNTER — Other Ambulatory Visit (INDEPENDENT_AMBULATORY_CARE_PROVIDER_SITE_OTHER): Payer: Managed Care, Other (non HMO)

## 2014-08-16 DIAGNOSIS — R748 Abnormal levels of other serum enzymes: Secondary | ICD-10-CM | POA: Diagnosis not present

## 2014-08-16 LAB — BASIC METABOLIC PANEL
BUN: 14 mg/dL (ref 6–23)
CO2: 28 meq/L (ref 19–32)
Calcium: 9.3 mg/dL (ref 8.4–10.5)
Chloride: 105 mEq/L (ref 96–112)
Creatinine, Ser: 0.78 mg/dL (ref 0.40–1.50)
GFR: 114.31 mL/min (ref >60.00)
GLUCOSE: 111 mg/dL — AB (ref 70–99)
POTASSIUM: 3.9 meq/L (ref 3.5–5.1)
SODIUM: 138 meq/L (ref 135–145)

## 2014-08-19 ENCOUNTER — Other Ambulatory Visit (INDEPENDENT_AMBULATORY_CARE_PROVIDER_SITE_OTHER): Payer: Managed Care, Other (non HMO)

## 2014-08-19 DIAGNOSIS — R945 Abnormal results of liver function studies: Secondary | ICD-10-CM | POA: Diagnosis not present

## 2014-08-19 LAB — HEPATIC FUNCTION PANEL
ALT: 37 U/L (ref 0–53)
AST: 37 U/L (ref 0–37)
Albumin: 4.1 g/dL (ref 3.5–5.2)
Alkaline Phosphatase: 93 U/L (ref 39–117)
Bilirubin, Direct: 0.1 mg/dL (ref 0.0–0.3)
TOTAL PROTEIN: 6.8 g/dL (ref 6.0–8.3)
Total Bilirubin: 0.5 mg/dL (ref 0.2–1.2)

## 2015-07-24 ENCOUNTER — Telehealth: Payer: Self-pay | Admitting: Behavioral Health

## 2015-07-24 NOTE — Telephone Encounter (Signed)
Unable to reach patient at time of Pre-Visit Call.  Left message for patient to return call when available.    

## 2015-07-25 ENCOUNTER — Telehealth: Payer: Self-pay | Admitting: Physician Assistant

## 2015-07-25 ENCOUNTER — Encounter: Payer: Managed Care, Other (non HMO) | Admitting: Physician Assistant

## 2015-07-29 NOTE — Telephone Encounter (Signed)
Pt was no show 07/25/15 for cpe, pt has not rescheduled, 1st no show, charge or no charge?

## 2015-07-29 NOTE — Telephone Encounter (Signed)
No charge for 1st no-show 

## 2017-07-01 ENCOUNTER — Encounter: Payer: Self-pay | Admitting: Medical

## 2017-07-01 ENCOUNTER — Ambulatory Visit: Payer: Commercial Managed Care - PPO | Admitting: Medical

## 2017-07-01 VITALS — BP 148/88 | HR 64 | Temp 97.9°F | Resp 16 | Ht 69.0 in | Wt 222.0 lb

## 2017-07-01 DIAGNOSIS — Z0000 Encounter for general adult medical examination without abnormal findings: Secondary | ICD-10-CM

## 2017-07-01 DIAGNOSIS — Z125 Encounter for screening for malignant neoplasm of prostate: Secondary | ICD-10-CM

## 2017-07-01 DIAGNOSIS — Z113 Encounter for screening for infections with a predominantly sexual mode of transmission: Secondary | ICD-10-CM

## 2017-07-01 DIAGNOSIS — Z8042 Family history of malignant neoplasm of prostate: Secondary | ICD-10-CM | POA: Diagnosis not present

## 2017-07-01 LAB — COMPREHENSIVE METABOLIC PANEL
ALT: 40 U/L (ref 0–53)
AST: 30 U/L (ref 0–37)
Albumin: 4.2 g/dL (ref 3.5–5.2)
Alkaline Phosphatase: 110 U/L (ref 39–117)
BUN: 10 mg/dL (ref 6–23)
CALCIUM: 9.1 mg/dL (ref 8.4–10.5)
CHLORIDE: 104 meq/L (ref 96–112)
CO2: 28 meq/L (ref 19–32)
CREATININE: 0.67 mg/dL (ref 0.40–1.50)
GFR: 134.54 mL/min (ref >60.00)
GLUCOSE: 114 mg/dL — AB (ref 70–99)
Potassium: 4 mEq/L (ref 3.5–5.1)
SODIUM: 140 meq/L (ref 135–145)
Total Bilirubin: 0.5 mg/dL (ref 0.2–1.2)
Total Protein: 6.7 g/dL (ref 6.0–8.3)

## 2017-07-01 LAB — CBC WITH DIFFERENTIAL/PLATELET
BASOS ABS: 0.1 10*3/uL (ref 0.0–0.1)
BASOS PCT: 1 % (ref 0.0–3.0)
Eosinophils Absolute: 0.3 10*3/uL (ref 0.0–0.7)
Eosinophils Relative: 4.2 % (ref 0.0–5.0)
HEMATOCRIT: 45.4 % (ref 39.0–52.0)
Hemoglobin: 15.7 g/dL (ref 13.0–17.0)
LYMPHS ABS: 2.1 10*3/uL (ref 0.7–4.0)
Lymphocytes Relative: 32.6 % (ref 12.0–46.0)
MCHC: 34.6 g/dL (ref 30.0–36.0)
MCV: 89.6 fl (ref 78.0–100.0)
MONO ABS: 0.5 10*3/uL (ref 0.1–1.0)
Monocytes Relative: 7.3 % (ref 3.0–12.0)
NEUTROS PCT: 54.9 % (ref 43.0–77.0)
Neutro Abs: 3.5 10*3/uL (ref 1.4–7.7)
PLATELETS: 260 10*3/uL (ref 150.0–400.0)
RBC: 5.06 Mil/uL (ref 4.22–5.81)
RDW: 13.2 % (ref 11.5–15.5)
WBC: 6.4 10*3/uL (ref 4.0–10.5)

## 2017-07-01 LAB — URINALYSIS, ROUTINE W REFLEX MICROSCOPIC
Bilirubin Urine: NEGATIVE
Hgb urine dipstick: NEGATIVE
Ketones, ur: NEGATIVE
Leukocytes, UA: NEGATIVE
Nitrite: NEGATIVE
PH: 5.5 (ref 5.0–8.0)
RBC / HPF: NONE SEEN (ref 0–?)
Total Protein, Urine: NEGATIVE
URINE GLUCOSE: NEGATIVE
UROBILINOGEN UA: 0.2 (ref 0.0–1.0)

## 2017-07-01 LAB — LIPID PANEL
CHOL/HDL RATIO: 3
Cholesterol: 112 mg/dL (ref 0–200)
HDL: 37.4 mg/dL — ABNORMAL LOW (ref >39.00)
LDL CALC: 57 mg/dL (ref 0–99)
NONHDL: 74.97
Triglycerides: 88 mg/dL (ref 0.0–149.0)
VLDL: 17.6 mg/dL (ref 0.0–40.0)

## 2017-07-01 LAB — PSA: PSA: 0.43 ng/mL (ref 0.10–4.00)

## 2017-07-01 NOTE — Patient Instructions (Addendum)
For you wellness exam today I have ordered cbc, cmp, lipid panel, ua and hiv. Include psa  Vaccine up to date.  Recommend exercise and healthy diet.  We will let you know lab results as they come in.   Advised low salt diet, increase exercise. Also 5-10 lb weight loss.  Can try to find [employer] wrist bp cuff to check bp.  Follow up date appointment will be determined after lab review.    Preventive Care 40-64 Years, Male Preventive care refers to lifestyle choices and visits with your health care provider that can promote health and wellness. What does preventive care include?  A yearly physical exam. This is also called an annual well check.  Dental exams once or twice a year.  Routine eye exams. Ask your health care provider how often you should have your eyes checked.  Personal lifestyle choices, including: ? Daily care of your teeth and gums. ? Regular physical activity. ? Eating a healthy diet. ? Avoiding tobacco and drug use. ? Limiting alcohol use. ? Practicing safe sex. ? Taking low-dose aspirin every day starting at age 14. What happens during an annual well check? The services and screenings done by your health care provider during your annual well check will depend on your age, overall health, lifestyle risk factors, and family history of disease. Counseling Your health care provider may ask you questions about your:  Alcohol use.  Tobacco use.  Drug use.  Emotional well-being.  Home and relationship well-being.  Sexual activity.  Eating habits.  Work and work Statistician.  Screening You may have the following tests or measurements:  Height, weight, and BMI.  Blood pressure.  Lipid and cholesterol levels. These may be checked every 5 years, or more frequently if you are over 7 years old.  Skin check.  Lung cancer screening. You may have this screening every year starting at age 37 if you have a 30-pack-year history of smoking and currently  smoke or have quit within the past 15 years.  Fecal occult blood test (FOBT) of the stool. You may have this test every year starting at age 20.  Flexible sigmoidoscopy or colonoscopy. You may have a sigmoidoscopy every 5 years or a colonoscopy every 10 years starting at age 43.  Prostate cancer screening. Recommendations will vary depending on your family history and other risks.  Hepatitis C blood test.  Hepatitis B blood test.  Sexually transmitted disease (STD) testing.  Diabetes screening. This is done by checking your blood sugar (glucose) after you have not eaten for a while (fasting). You may have this done every 1-3 years.  Discuss your test results, treatment options, and if necessary, the need for more tests with your health care provider. Vaccines Your health care provider may recommend certain vaccines, such as:  Influenza vaccine. This is recommended every year.  Tetanus, diphtheria, and acellular pertussis (Tdap, Td) vaccine. You may need a Td booster every 10 years.  Varicella vaccine. You may need this if you have not been vaccinated.  Zoster vaccine. You may need this after age 24.  Measles, mumps, and rubella (MMR) vaccine. You may need at least one dose of MMR if you were born in 1957 or later. You may also need a second dose.  Pneumococcal 13-valent conjugate (PCV13) vaccine. You may need this if you have certain conditions and have not been vaccinated.  Pneumococcal polysaccharide (PPSV23) vaccine. You may need one or two doses if you smoke cigarettes or if you have  certain conditions.  Meningococcal vaccine. You may need this if you have certain conditions.  Hepatitis A vaccine. You may need this if you have certain conditions or if you travel or work in places where you may be exposed to hepatitis A.  Hepatitis B vaccine. You may need this if you have certain conditions or if you travel or work in places where you may be exposed to hepatitis  B.  Haemophilus influenzae type b (Hib) vaccine. You may need this if you have certain risk factors.  Talk to your health care provider about which screenings and vaccines you need and how often you need them. This information is not intended to replace advice given to you by your health care provider. Make sure you discuss any questions you have with your health care provider. Document Released: 03/07/2015 Document Revised: 10/29/2015 Document Reviewed: 12/10/2014 Elsevier Interactive Patient Education  [employer].

## 2017-07-01 NOTE — Progress Notes (Signed)
Subjective:    Patient ID: Gary Booth, male    DOB: October 27, 1970, 47 y.o.   MRN: [id number]  HPI  Pt in for evaluation. 1st time here.  Works Geographical information systems officer at [employer], no exercise regularly, eats moderate well, 3 cups coffee a day. Married- 1 chid.  Pt states he is trying to establish care with MD near his house. So he expresses he might be able to see her next April.   He feels fine today.  On review of his problem list he states neck lump was benign. Studies done per pt. No pain. Has not changed in size. I looked in epic for [place] or other imaging and did not find. Found ENT note. So appears ENT ordered and reviewed.  His scrotal lump was spermatocele or epididymal cyst. Also small hydrocele.  No acute problems today.  Triglycerides mild elevated at work screening exam.  Pt states at home monitor blood pressure seems elevated.(sysolic was 162-170  range)He questions accuracy. He states cut back on salts and eating more vegetable.    Review of Systems  Constitutional: Negative for chills, fatigue and fever.  HENT: Negative for congestion, drooling, ear discharge, hearing loss, sinus pressure, sinus pain and sneezing.   Respiratory: Negative for cough, chest tightness, shortness of breath and wheezing.   Cardiovascular: Negative for chest pain and palpitations.  Gastrointestinal: Negative for abdominal pain, constipation, nausea and vomiting.  Genitourinary: Negative for difficulty urinating, dysuria, flank pain, frequency, penile pain, penile swelling, scrotal swelling and testicular pain.  Musculoskeletal: Negative for back pain, gait problem, myalgias and neck pain.  Skin: Negative for rash.  Hematological: Negative for adenopathy. Does not bruise/bleed easily.  Psychiatric/Behavioral: Negative for behavioral problems, confusion and dysphoric mood. The patient is not hyperactive.     Past Medical History:  Diagnosis Date  . Chicken pox   . Seasonal allergies       Social History   Socioeconomic History  . Marital status: Married    Spouse name: Not on file  . Number of children: Not on file  . Years of education: Not on file  . Highest education level: Not on file  Occupational History  . Not on file  Social Needs  . Financial resource strain: Not on file  . Food insecurity:    Worry: Not on file    Inability: Not on file  . Transportation needs:    Medical: Not on file    Non-medical: Not on file  Tobacco Use  . Smoking status: Former Smoker    Packs/day: 0.25    Years: 1.00    Pack years: 0.25    Last attempt to quit: 07/11/1988    Years since quitting: 28.9  . Smokeless tobacco: Former Neurosurgeon    Types: Chew  . Tobacco comment: 2. 5 years  Substance and Sexual Activity  . Alcohol use: Yes    Alcohol/week: 0.0 oz    Comment: occasional  . Drug use: No  . Sexual activity: Not on file  Lifestyle  . Physical activity:    Days per week: Not on file    Minutes per session: Not on file  . Stress: Not on file  Relationships  . Social connections:    Talks on phone: Not on file    Gets together: Not on file    Attends religious service: Not on file    Active member of club or organization: Not on file    Attends meetings of clubs or  organizations: Not on file    Relationship status: Not on file  . Intimate partner violence:    Fear of current or ex partner: Not on file    Emotionally abused: Not on file    Physically abused: Not on file    Forced sexual activity: Not on file  Other Topics Concern  . Not on file  Social History Narrative  . Not on file    Past Surgical History:  Procedure Laterality Date  . BLEPHAROPLASTY  2010   left eye-lazy eye lid  . FINGER SURGERY     Dislocation 4th - Left  . TOE SURGERY     Right 2nd - bone spur  . WISDOM TOOTH EXTRACTION      Family History  Problem Relation Age of Onset  . Arthritis Mother        Living  . Arthritis Paternal Grandmother   . Prostate cancer Father         Living  . Alzheimer's disease Paternal Grandfather   . Deep vein thrombosis Maternal Grandmother   . Stroke Maternal Grandmother   . Irritable bowel syndrome Brother   . Crohn's disease Brother   . Healthy Daughter     No Known Allergies  No current outpatient medications on file prior to visit.   No current facility-administered medications on file prior to visit.     BP (!) 151/82   Pulse 64   Temp 97.9 F (36.6 C) (Oral)   Resp 16   Ht  (1.753 m)   Wt 222 lb (100.7 kg)   BMI 32.78 kg/m      Objective:   Physical Exam  General Mental Status- Alert. General Appearance- Not in acute distress.   Skin General: Color- Normal Color. Moisture- Normal Moisture. No worrisome moles or lesions.  Neck Carotid Arteries- Normal color. Moisture- Normal Moisture. No carotid bruits. No JVD.  Chest and Lung Exam Auscultation: Breath Sounds:-Normal.  Cardiovascular Auscultation:Rythm- Regular. Murmurs & Other Heart Sounds:Auscultation of the heart reveals- No Murmurs.  Abdomen Inspection:-Inspeection Normal. Palpation/Percussion:Note:No mass. Palpation and Percussion of the abdomen reveal- Non Tender, Non Distended + BS, no rebound or guarding.  Neurologic Cranial Nerve exam:- CN III-XII intact(No nystagmus), symmetric smile. Strength:- 5/5 equal and symmetric strength both upper and lower extremities.  Rectal and gential exam- declined.    Assessment & Plan:  For you wellness exam today I have ordered cbc, cmp, lipid panel, ua and hiv. Include psa  Vaccine up to date.  Recommend exercise and healthy diet.  We will let you know lab results as they come in.   Advised low salt diet, increase exercise. Also 5-10 lb weight loss.  Can try to find [employer] wrist bp cuff to check bp.(asked to establish my chart account and send me daily readings in about 2 weeks)  Follow up date appointment will be determined after lab review.    Esperanza Richters, PA-C

## 2017-07-02 LAB — HIV ANTIBODY (ROUTINE TESTING W REFLEX): HIV: NONREACTIVE

## 2017-07-04 ENCOUNTER — Other Ambulatory Visit (INDEPENDENT_AMBULATORY_CARE_PROVIDER_SITE_OTHER): Payer: Commercial Managed Care - PPO

## 2017-07-04 DIAGNOSIS — R739 Hyperglycemia, unspecified: Secondary | ICD-10-CM | POA: Diagnosis not present

## 2017-07-04 LAB — HEMOGLOBIN A1C: Hgb A1c MFr Bld: 6.2 % (ref 4.6–6.5)

## 2017-09-28 DIAGNOSIS — M545 Low back pain, unspecified: Secondary | ICD-10-CM | POA: Insufficient documentation

## 2017-09-28 DIAGNOSIS — S39012A Strain of muscle, fascia and tendon of lower back, initial encounter: Secondary | ICD-10-CM | POA: Insufficient documentation

## 2017-10-17 DIAGNOSIS — M79604 Pain in right leg: Secondary | ICD-10-CM | POA: Insufficient documentation

## 2018-01-23 ENCOUNTER — Encounter: Payer: Self-pay | Admitting: Medical

## 2018-01-24 ENCOUNTER — Encounter: Payer: Self-pay | Admitting: Medical

## 2018-01-24 ENCOUNTER — Ambulatory Visit: Payer: Commercial Managed Care - PPO | Admitting: Medical

## 2018-01-24 VITALS — BP 140/80 | HR 68 | Temp 98.2°F | Resp 16 | Ht 69.0 in | Wt 224.6 lb

## 2018-01-24 DIAGNOSIS — I10 Essential (primary) hypertension: Secondary | ICD-10-CM

## 2018-01-24 NOTE — Patient Instructions (Signed)
Your bp is borderline high today. I do want you to check blood pressure level with home bp monitor. If bp are close to 140/90 or higher then would recommend start meds  . Recommend conservative measures again and follow up in 3 months or sooner with bp readings at least 3-4 times a week.  I gave you copy of your health screen form.

## 2018-01-24 NOTE — Progress Notes (Signed)
Subjective:    Patient ID: Gary Booth, male    DOB: 1970-09-19, 47 y.o.   MRN: [id number]  HPI  Pt in to get Health provider screening form filled out. He had cpe done in may 2019. I filled out form today.   His bp is borderline today. No cardiac or neurologic signs or symptoms. Pt admits did not get bp cuff as I asked him on cpe. He does not eat salt so explains did not have to modify diet in that regard. He did not loose weight. About same weight  as before.   Review of Systems  Constitutional: Negative for chills, fatigue and fever.  Respiratory: Negative for cough, chest tightness, shortness of breath and wheezing.   Cardiovascular: Negative for chest pain and palpitations.  Gastrointestinal: Negative for abdominal pain, diarrhea and nausea.  Genitourinary: Negative for decreased urine volume and difficulty urinating.  Musculoskeletal: Negative for back pain, joint swelling, myalgias and neck stiffness.  Skin: Negative for rash.  Neurological: Negative for dizziness, facial asymmetry, speech difficulty, weakness and light-headedness.  Hematological: Negative for adenopathy. Does not bruise/bleed easily.  Psychiatric/Behavioral: Negative for behavioral problems, confusion and dysphoric mood.     Past Medical History:  Diagnosis Date  . Allergy   . Chicken pox   . Hyperlipidemia   . Seasonal allergies      Social History   Socioeconomic History  . Marital status: Married    Spouse name: Not on file  . Number of children: Not on file  . Years of education: Not on file  . Highest education level: Not on file  Occupational History  . Not on file  Social Needs  . Financial resource strain: Not on file  . Food insecurity:    Worry: Not on file    Inability: Not on file  . Transportation needs:    Medical: Not on file    Non-medical: Not on file  Tobacco Use  . Smoking status: Former Smoker    Packs/day: 0.25    Years: 1.00    Pack years: 0.25    Last  attempt to quit: 07/11/1988    Years since quitting: 29.5  . Smokeless tobacco: Former Neurosurgeon    Types: Chew  . Tobacco comment: 2. 5 years  Substance and Sexual Activity  . Alcohol use: Yes    Alcohol/week: 0.0 standard drinks    Comment: occasional  . Drug use: No  . Sexual activity: Yes  Lifestyle  . Physical activity:    Days per week: Not on file    Minutes per session: Not on file  . Stress: Not on file  Relationships  . Social connections:    Talks on phone: Not on file    Gets together: Not on file    Attends religious service: Not on file    Active member of club or organization: Not on file    Attends meetings of clubs or organizations: Not on file    Relationship status: Not on file  . Intimate partner violence:    Fear of current or ex partner: Not on file    Emotionally abused: Not on file    Physically abused: Not on file    Forced sexual activity: Not on file  Other Topics Concern  . Not on file  Social History Narrative  . Not on file    Past Surgical History:  Procedure Laterality Date  . BLEPHAROPLASTY  2010   left eye-lazy eye lid  .  FINGER SURGERY     Dislocation 4th - Left  . TOE SURGERY     Right 2nd - bone spur  . WISDOM TOOTH EXTRACTION      Family History  Problem Relation Age of Onset  . Arthritis Mother        Living  . Arthritis Paternal Grandmother   . Prostate cancer Father        Living  . Alzheimer's disease Paternal Grandfather   . Deep vein thrombosis Maternal Grandmother   . Stroke Maternal Grandmother   . Irritable bowel syndrome Brother   . Crohn's disease Brother   . Healthy Daughter     No Known Allergies  No current outpatient medications on file prior to visit.   No current facility-administered medications on file prior to visit.     BP 140/80   Pulse 68   Temp 98.2 F (36.8 C) (Oral)   Resp 16   Ht 5\' 9"  (1.753 m)   Wt 224 lb 9.6 oz (101.9 kg)   SpO2 98%   BMI 33.17 kg/m       Objective:    Physical Exam  General Mental Status- Alert. General Appearance- Not in acute distress.   Skin General: Color- Normal Color. Moisture- Normal Moisture.  Neck Carotid Arteries- Normal color. Moisture- Normal Moisture. No carotid bruits. No JVD.  Chest and Lung Exam Auscultation: Breath Sounds:-Normal.  Cardiovascular Auscultation:Rythm- Regular. Murmurs & Other Heart Sounds:Auscultation of the heart reveals- No Murmurs.  Abdomen Inspection:-Inspeection Normal. Palpation/Percussion:Note:No mass. Palpation and Percussion of the abdomen reveal- Non Tender, Non Distended + BS, no rebound or guarding.   Neurologic Cranial Nerve exam:- CN III-XII intact(No nystagmus), symmetric smile. Strength:- 5/5 equal and symmetric strength both upper and lower extremities.      Assessment & Plan:  Your bp is borderline high today. I do want you to check blood pressure level with home bp monitor. If bp are close to 140/90 or higher then would recommend start meds  . Recommend conservative measures again and follow up in 3 months or sooner with bp readings at least 3-4 times a week.  I gave you copy of your health screen form.  25 minutes spent with pt. 50% of time explained on importance of bp control and monitoring bp to determined if needs to start medication. Also time taken to fill out form as well.  Esperanza RichtersEdward Maico Mulvehill, PA-C

## 2019-02-22 ENCOUNTER — Ambulatory Visit: Payer: Commercial Managed Care - PPO | Admitting: Medical

## 2019-02-22 ENCOUNTER — Other Ambulatory Visit: Payer: Self-pay

## 2019-02-22 ENCOUNTER — Encounter: Payer: Self-pay | Admitting: Medical

## 2019-02-22 VITALS — BP 160/100 | HR 73 | Temp 97.2°F | Resp 18 | Ht 69.0 in | Wt 226.0 lb

## 2019-02-22 DIAGNOSIS — R002 Palpitations: Secondary | ICD-10-CM

## 2019-02-22 DIAGNOSIS — I10 Essential (primary) hypertension: Secondary | ICD-10-CM | POA: Diagnosis not present

## 2019-02-22 DIAGNOSIS — R739 Hyperglycemia, unspecified: Secondary | ICD-10-CM | POA: Diagnosis not present

## 2019-02-22 LAB — COMPREHENSIVE METABOLIC PANEL
ALT: 37 U/L (ref 0–53)
AST: 31 U/L (ref 0–37)
Albumin: 4.4 g/dL (ref 3.5–5.2)
Alkaline Phosphatase: 112 U/L (ref 39–117)
BUN: 9 mg/dL (ref 6–23)
CO2: 27 mEq/L (ref 19–32)
Calcium: 9.1 mg/dL (ref 8.4–10.5)
Chloride: 103 mEq/L (ref 96–112)
Creatinine, Ser: 0.74 mg/dL (ref 0.40–1.50)
GFR: 112.1 mL/min (ref >60.00)
Glucose, Bld: 171 mg/dL — ABNORMAL HIGH (ref 70–99)
Potassium: 3.9 mEq/L (ref 3.5–5.1)
Sodium: 138 mEq/L (ref 135–145)
Total Bilirubin: 0.5 mg/dL (ref 0.2–1.2)
Total Protein: 6.6 g/dL (ref 6.0–8.3)

## 2019-02-22 LAB — HEMOGLOBIN A1C: Hgb A1c MFr Bld: 6.7 % — ABNORMAL HIGH (ref 4.6–6.5)

## 2019-02-22 MED ORDER — LOSARTAN POTASSIUM-HCTZ 100-12.5 MG PO TABS: 1.0000 | ORAL_TABLET | Freq: Every day | ORAL | 0 refills | Status: DC

## 2019-02-22 NOTE — Progress Notes (Signed)
Subjective:    Patient ID: Gary Booth, male    DOB: 10-03-1970, 48 y.o.   MRN: [id number]  HPI  Pt in for follow up on his bp. Pt bp was borderline high last time. Now his bp since last visit has been 164/100, 159/106, 187/107/189/106, 187/110 and 177/106.  Pt denies any caffeine use. Does not eat excess salt and overall describes healthy diet.  Pt dad had htn since he was 26 year old.  Pt mentioned on month ago had brief palpitation sensation but none since. He states go better with diet. He also mentions every once in a while he will wake with sensation of fast heart rate.   No random sweat episodes.     Review of Systems  Constitutional: Negative for chills, fatigue and fever.  HENT: Negative for congestion.   Respiratory: Negative for cough, chest tightness, shortness of breath and wheezing.   Cardiovascular: Positive for palpitations. Negative for chest pain.       None present. Maybe last night as he woke up and though hr was elevated.  Gastrointestinal: Negative for abdominal pain.  Musculoskeletal: Negative for back pain.  Skin: Negative for rash.  Neurological: Negative for dizziness, weakness and headaches.  Hematological: Negative for adenopathy. Does not bruise/bleed easily.  Psychiatric/Behavioral: Negative for behavioral problems.    Past Medical History:  Diagnosis Date  . Allergy   . Chicken pox   . Hyperlipidemia   . Seasonal allergies      Social History   Socioeconomic History  . Marital status: Married    Spouse name: Not on file  . Number of children: Not on file  . Years of education: Not on file  . Highest education level: Not on file  Occupational History  . Not on file  Tobacco Use  . Smoking status: Former Smoker    Packs/day: 0.25    Years: 1.00    Pack years: 0.25    Quit date: 07/11/1988    Years since quitting: 30.6  . Smokeless tobacco: Former Neurosurgeon    Types: Chew  . Tobacco comment: 2. 5 years  Substance and Sexual  Activity  . Alcohol use: Yes    Alcohol/week: 0.0 standard drinks    Comment: occasional  . Drug use: No  . Sexual activity: Yes  Other Topics Concern  . Not on file  Social History Narrative  . Not on file   Social Determinants of Health   Financial Resource Strain:   . Difficulty of Paying Living Expenses: Not on file  Food Insecurity:   . Worried About Programme researcher, broadcasting/film/video in the Last Year: Not on file  . Ran Out of Food in the Last Year: Not on file  Transportation Needs:   . Lack of Transportation (Medical): Not on file  . Lack of Transportation (Non-Medical): Not on file  Physical Activity:   . Days of Exercise per Week: Not on file  . Minutes of Exercise per Session: Not on file  Stress:   . Feeling of Stress : Not on file  Social Connections:   . Frequency of Communication with Friends and Family: Not on file  . Frequency of Social Gatherings with Friends and Family: Not on file  . Attends Religious Services: Not on file  . Active Member of Clubs or Organizations: Not on file  . Attends Banker Meetings: Not on file  . Marital Status: Not on file  Intimate Partner Violence:   . Fear  of Current or Ex-Partner: Not on file  . Emotionally Abused: Not on file  . Physically Abused: Not on file  . Sexually Abused: Not on file    Past Surgical History:  Procedure Laterality Date  . BLEPHAROPLASTY  2010   left eye-lazy eye lid  . FINGER SURGERY     Dislocation 4th - Left  . TOE SURGERY     Right 2nd - bone spur  . WISDOM TOOTH EXTRACTION      Family History  Problem Relation Age of Onset  . Arthritis Mother        Living  . Arthritis Paternal Grandmother   . Prostate cancer Father        Living  . Alzheimer's disease Paternal Grandfather   . Deep vein thrombosis Maternal Grandmother   . Stroke Maternal Grandmother   . Irritable bowel syndrome Brother   . Crohn's disease Brother   . Healthy Daughter     No Known Allergies  No current  outpatient medications on file prior to visit.   No current facility-administered medications on file prior to visit.    BP (!) 160/100 (BP Location: Right Arm, Patient Position: Sitting, Cuff Size: Normal)   Pulse 73   Temp (!) 97.2 F (36.2 C) (Temporal)   Resp 18   Ht 5\' 9"  (1.753 m)   Wt 226 lb (102.5 kg)   SpO2 96%   BMI 33.37 kg/m       Objective:   Physical Exam  General Mental Status- Alert. General Appearance- Not in acute distress.   Skin General: Color- Normal Color. Moisture- Normal Moisture.  Neck Carotid Arteries- Normal color. Moisture- Normal Moisture. No carotid bruits. No JVD.  Chest and Lung Exam Auscultation: Breath Sounds:-Normal.  Cardiovascular Auscultation:Rythm- Regular. Murmurs & Other Heart Sounds:Auscultation of the heart reveals- No Murmurs.  Abdomen Inspection:-Inspeection Normal. Palpation/Percussion:Note:No mass. Palpation and Percussion of the abdomen reveal- Non Tender, Non Distended + BS, no rebound or guarding.    Neurologic Cranial Nerve exam:- CN III-XII intact(No nystagmus), symmetric smile. Strength:- 5/5 equal and symmetric strength both upper and lower extremities.  Lower ext- no pedal edema. calfs symmetric.    Assessment & Plan:  Your bp level are moderate to severe high over past weeks. So I do think giving combination medication good idea today.  Recommend low salt diet, and daily exercise. But only mild short walks presently. When bp better controlled can exercise more.  You have occasional palpitation/ tacyhcardia. ekg today looked normal sinus rhythm.  Avoid caffeine. If you do get palpitation sensation or fast heart rate sensation then recommend check pulse.  If bp not coming down then can refer you to cardiologist as you asked about. Or if palpitation keep occurring then would refer for holter monitor.(can refer at any time if you want as wife questioned if necessary. Not required at this point but can refer  if you want.)  Follow up in 2 weeks or as needed.  25 minutes spent with pt. 50% of time spent counseling on plan going forward.

## 2019-02-22 NOTE — Patient Instructions (Signed)
Your bp level are moderate to severe high over past weeks. So I do think giving combination medication losartan/hctz good idea today.  Recommend low salt diet, and daily exercise. But only mild short walks presently. When bp better controlled can exercise more.  You have occasional palpitation/ tacyhcardia. ekg today looked normal sinus rhythm.  Avoid caffeine. If you do get palpitation sensation or fast heart rate sensation then recommend check pulse.  If bp not coming down then can refer you to cardiologist as you asked about. Or if palpitation keep occurring then would refer for holter monitor.(can refer at any time if you want as wife questioned if necessary. Not required at this point but can refer if you want.)  Follow up in 2 weeks or as needed.

## 2019-02-25 ENCOUNTER — Encounter: Payer: Self-pay | Admitting: Medical

## 2019-02-27 ENCOUNTER — Telehealth: Payer: Self-pay | Admitting: Medical

## 2019-02-27 DIAGNOSIS — E119 Type 2 diabetes mellitus without complications: Secondary | ICD-10-CM

## 2019-02-27 NOTE — Telephone Encounter (Signed)
Referral to diabetic education placed. 

## 2019-03-02 ENCOUNTER — Ambulatory Visit: Payer: Commercial Managed Care - PPO | Admitting: Medical

## 2019-03-16 ENCOUNTER — Ambulatory Visit: Payer: Commercial Managed Care - PPO | Admitting: Dietician

## 2019-03-19 ENCOUNTER — Other Ambulatory Visit: Payer: Self-pay

## 2019-03-19 ENCOUNTER — Ambulatory Visit: Payer: Commercial Managed Care - PPO | Admitting: Medical

## 2019-03-19 ENCOUNTER — Encounter: Payer: Self-pay | Admitting: Medical

## 2019-03-19 VITALS — BP 137/80 | HR 63 | Temp 96.5°F | Resp 18 | Ht 69.0 in | Wt 224.4 lb

## 2019-03-19 DIAGNOSIS — E119 Type 2 diabetes mellitus without complications: Secondary | ICD-10-CM

## 2019-03-19 DIAGNOSIS — R739 Hyperglycemia, unspecified: Secondary | ICD-10-CM

## 2019-03-19 DIAGNOSIS — I10 Essential (primary) hypertension: Secondary | ICD-10-CM | POA: Diagnosis not present

## 2019-03-19 MED ORDER — LOSARTAN POTASSIUM-HCTZ 100-12.5 MG PO TABS: 1.0000 | ORAL_TABLET | Freq: Every day | ORAL | 1 refills | Status: DC

## 2019-03-19 NOTE — Progress Notes (Signed)
Subjective:    Patient ID: Gary Booth, male    DOB: May 27, 1970, 49 y.o.   MRN: [id number]  HPI  Pt in for htn follow up.  His blood pressure have come down a lot from last note readings. Pt states at home most of his reading 140-154/80-90 range.  No cardiac or neurologic signs or symptoms.   Pt is trying to stop eating salt. He is cutting.  Pt is not exercising presently.   Pt has high sugar average a1c of 6.7. He did not attend diabetic.    Review of Systems  Constitutional: Negative for chills, fatigue and fever.  Respiratory: Negative for choking, chest tightness, shortness of breath and wheezing.   Cardiovascular: Negative for chest pain and palpitations.  Gastrointestinal: Negative for abdominal pain.  Genitourinary: Negative for dysuria.  Musculoskeletal: Negative for back pain.  Skin: Negative for rash.  Neurological: Negative for dizziness, weakness and light-headedness.  Hematological: Negative for adenopathy. Does not bruise/bleed easily.  Psychiatric/Behavioral: Negative for behavioral problems and confusion.    Past Medical History:  Diagnosis Date  . Allergy   . Chicken pox   . Hyperlipidemia   . Seasonal allergies      Social History   Socioeconomic History  . Marital status: Married    Spouse name: Not on file  . Number of children: Not on file  . Years of education: Not on file  . Highest education level: Not on file  Occupational History  . Not on file  Tobacco Use  . Smoking status: Former Smoker    Packs/day: 0.25    Years: 1.00    Pack years: 0.25    Quit date: 07/11/1988    Years since quitting: 30.7  . Smokeless tobacco: Former Neurosurgeon    Types: Chew  . Tobacco comment: 2. 5 years  Substance and Sexual Activity  . Alcohol use: Yes    Alcohol/week: 0.0 standard drinks    Comment: occasional  . Drug use: No  . Sexual activity: Yes  Other Topics Concern  . Not on file  Social History Narrative  . Not on file   Social  Determinants of Health   Financial Resource Strain:   . Difficulty of Paying Living Expenses: Not on file  Food Insecurity:   . Worried About Programme researcher, broadcasting/film/video in the Last Year: Not on file  . Ran Out of Food in the Last Year: Not on file  Transportation Needs:   . Lack of Transportation (Medical): Not on file  . Lack of Transportation (Non-Medical): Not on file  Physical Activity:   . Days of Exercise per Week: Not on file  . Minutes of Exercise per Session: Not on file  Stress:   . Feeling of Stress : Not on file  Social Connections:   . Frequency of Communication with Friends and Family: Not on file  . Frequency of Social Gatherings with Friends and Family: Not on file  . Attends Religious Services: Not on file  . Active Member of Clubs or Organizations: Not on file  . Attends Banker Meetings: Not on file  . Marital Status: Not on file  Intimate Partner Violence:   . Fear of Current or Ex-Partner: Not on file  . Emotionally Abused: Not on file  . Physically Abused: Not on file  . Sexually Abused: Not on file    Past Surgical History:  Procedure Laterality Date  . BLEPHAROPLASTY  2010   left eye-lazy eye lid  .  FINGER SURGERY     Dislocation 4th - Left  . TOE SURGERY     Right 2nd - bone spur  . WISDOM TOOTH EXTRACTION      Family History  Problem Relation Age of Onset  . Arthritis Mother        Living  . Arthritis Paternal Grandmother   . Prostate cancer Father        Living  . Alzheimer's disease Paternal Grandfather   . Deep vein thrombosis Maternal Grandmother   . Stroke Maternal Grandmother   . Irritable bowel syndrome Brother   . Crohn's disease Brother   . Healthy Daughter     No Known Allergies  Current Outpatient Medications on File Prior to Visit  Medication Sig Dispense Refill  . losartan-hydrochlorothiazide (HYZAAR) 100-12.5 MG tablet Take 1 tablet by mouth daily. 30 tablet 0   No current facility-administered medications on  file prior to visit.    BP (!) 142/88 (BP Location: Right Arm, Patient Position: Sitting, Cuff Size: Normal)   Pulse 63   Temp (!) 96.5 F (35.8 C) (Temporal)   Resp 18   Ht 5\' 9"  (1.753 m)   Wt 224 lb 6.4 oz (101.8 kg)   SpO2 98%   BMI 33.14 kg/m       Objective:   Physical Exam  General Mental Status- Alert. General Appearance- Not in acute distress.   Skin General: Color- Normal Color. Moisture- Normal Moisture.  Neck Carotid Arteries- Normal color. Moisture- Normal Moisture. No carotid bruits. No JVD.  Chest and Lung Exam Auscultation: Breath Sounds:-Normal.  Cardiovascular Auscultation:Rythm- Regular. Murmurs & Other Heart Sounds:Auscultation of the heart reveals- No Murmurs.  Abdomen Inspection:-Inspeection Normal. Palpation/Percussion:Note:No mass. Palpation and Percussion of the abdomen reveal- Non Tender, Non Distended + BS, no rebound or guarding.    Neurologic Cranial Nerve exam:- CN III-XII intact(No nystagmus), symmetric smile. Strength:- 5/5 equal and symmetric strength both upper and lower extremities.      Assessment & Plan:  For elevated blood pressure continue current bp medication. I think 10 lb wt loss will drop bp about 10 points top and bottom.  For elevated sugar/technically in diabetic range recommend low sugar diet and exercise.    Will get lipid panel on future labs.  Recommend get future cmp, a1c and lipid panel scheduled for future labs toward end of 1st week april  Follow up 3 month or so. Can be virtual if more  convenient.  30 minutes spent with pt. 50% of time spent counseling on plan going forward and answering pt questions.

## 2019-03-19 NOTE — Patient Instructions (Addendum)
For elevated blood pressure continue current bp medication. I think 10 lb wt loss will drop bp about 10 points top and bottom.  For elevated sugar/technically in diabetic range recommend low sugar diet and exercise.    Will get lipid panel on future labs.  Recommend get future cmp, a1c and lipid panel scheduled for future labs toward end of 1st week april  Follow up 3 month or so. Can be virtual if more convenient.

## 2019-04-21 ENCOUNTER — Other Ambulatory Visit: Payer: Self-pay | Admitting: Medical

## 2019-09-21 ENCOUNTER — Other Ambulatory Visit: Payer: Self-pay

## 2019-09-21 ENCOUNTER — Ambulatory Visit (INDEPENDENT_AMBULATORY_CARE_PROVIDER_SITE_OTHER): Payer: Commercial Managed Care - PPO | Admitting: Medical

## 2019-09-21 VITALS — BP 121/76 | HR 61 | Temp 97.9°F | Resp 18 | Ht 69.0 in | Wt 220.6 lb

## 2019-09-21 DIAGNOSIS — Z1211 Encounter for screening for malignant neoplasm of colon: Secondary | ICD-10-CM

## 2019-09-21 DIAGNOSIS — Z125 Encounter for screening for malignant neoplasm of prostate: Secondary | ICD-10-CM | POA: Diagnosis not present

## 2019-09-21 DIAGNOSIS — E119 Type 2 diabetes mellitus without complications: Secondary | ICD-10-CM | POA: Diagnosis not present

## 2019-09-21 DIAGNOSIS — Z0000 Encounter for general adult medical examination without abnormal findings: Secondary | ICD-10-CM

## 2019-09-21 DIAGNOSIS — I10 Essential (primary) hypertension: Secondary | ICD-10-CM | POA: Diagnosis not present

## 2019-09-21 LAB — CBC WITH DIFFERENTIAL/PLATELET
Basophils Absolute: 0.1 10*3/uL (ref 0.0–0.1)
Basophils Relative: 1.1 % (ref 0.0–3.0)
Eosinophils Absolute: 0.3 10*3/uL (ref 0.0–0.7)
Eosinophils Relative: 3.7 % (ref 0.0–5.0)
HCT: 45.1 % (ref 39.0–52.0)
Hemoglobin: 15.5 g/dL (ref 13.0–17.0)
Lymphocytes Relative: 30.8 % (ref 12.0–46.0)
Lymphs Abs: 2.3 10*3/uL (ref 0.7–4.0)
MCHC: 34.4 g/dL (ref 30.0–36.0)
MCV: 89.6 fl (ref 78.0–100.0)
Monocytes Absolute: 0.6 10*3/uL (ref 0.1–1.0)
Monocytes Relative: 8.4 % (ref 3.0–12.0)
Neutro Abs: 4.2 10*3/uL (ref 1.4–7.7)
Neutrophils Relative %: 56 % (ref 43.0–77.0)
Platelets: 269 10*3/uL (ref 150.0–400.0)
RBC: 5.03 Mil/uL (ref 4.22–5.81)
RDW: 12.6 % (ref 11.5–15.5)
WBC: 7.4 10*3/uL (ref 4.0–10.5)

## 2019-09-21 LAB — COMPREHENSIVE METABOLIC PANEL
ALT: 46 U/L (ref 0–53)
AST: 34 U/L (ref 0–37)
Albumin: 4.6 g/dL (ref 3.5–5.2)
Alkaline Phosphatase: 114 U/L (ref 39–117)
BUN: 14 mg/dL (ref 6–23)
CO2: 27 mEq/L (ref 19–32)
Calcium: 9.5 mg/dL (ref 8.4–10.5)
Chloride: 104 mEq/L (ref 96–112)
Creatinine, Ser: 0.82 mg/dL (ref 0.40–1.50)
GFR: 99.34 mL/min (ref >60.00)
Glucose, Bld: 140 mg/dL — ABNORMAL HIGH (ref 70–99)
Potassium: 3.9 mEq/L (ref 3.5–5.1)
Sodium: 139 mEq/L (ref 135–145)
Total Bilirubin: 0.6 mg/dL (ref 0.2–1.2)
Total Protein: 7.1 g/dL (ref 6.0–8.3)

## 2019-09-21 LAB — LIPID PANEL
Cholesterol: 127 mg/dL (ref 0–200)
HDL: 35.2 mg/dL — ABNORMAL LOW (ref >39.00)
LDL Cholesterol: 67 mg/dL (ref 0–99)
NonHDL: 91.34
Total CHOL/HDL Ratio: 4
Triglycerides: 120 mg/dL (ref 0.0–149.0)
VLDL: 24 mg/dL (ref 0.0–40.0)

## 2019-09-21 LAB — PSA: PSA: 0.35 ng/mL (ref 0.10–4.00)

## 2019-09-21 LAB — HEMOGLOBIN A1C: Hgb A1c MFr Bld: 7 % — ABNORMAL HIGH (ref 4.6–6.5)

## 2019-09-21 MED ORDER — LOSARTAN POTASSIUM-HCTZ 100-12.5 MG PO TABS: 1.0000 | ORAL_TABLET | Freq: Every day | ORAL | 3 refills | Status: DC

## 2019-09-21 NOTE — Patient Instructions (Addendum)
For you wellness exam today I have ordered cbc, cmp, psa, a1c and  lipid panel.  Vaccines appear up to date. covid vaccine done.  Recommend continue  exercise and healthy/low sugar diet.  We will let you know lab results as they come in.  Follow up date appointment will be determined after lab review.   Referral to gi for colonoscopy.  Bp well controlled.    Preventive Care 50-50 Years Old, Male Preventive care refers to lifestyle choices and visits with your health care provider that can promote health and wellness. This includes:  A yearly physical exam. This is also called an annual well check.  Regular dental and eye exams.  Immunizations.  Screening for certain conditions.  Healthy lifestyle choices, such as eating a healthy diet, getting regular exercise, not using drugs or products that contain nicotine and tobacco, and limiting alcohol use. What can I expect for my preventive care visit? Physical exam Your health care provider will check:  Height and weight. These may be used to calculate body mass index (BMI), which is a measurement that tells if you are at a healthy weight.  Heart rate and blood pressure.  Your skin for abnormal spots. Counseling Your health care provider may ask you questions about:  Alcohol, tobacco, and drug use.  Emotional well-being.  Home and relationship well-being.  Sexual activity.  Eating habits.  Work and work Statistician. What immunizations do I need?  Influenza (flu) vaccine  This is recommended every year. Tetanus, diphtheria, and pertussis (Tdap) vaccine  You may need a Td booster every 10 years. Varicella (chickenpox) vaccine  You may need this vaccine if you have not already been vaccinated. Zoster (shingles) vaccine  You may need this after age 42. Measles, mumps, and rubella (MMR) vaccine  You may need at least one dose of MMR if you were born in 1957 or later. You may also need a second  dose. Pneumococcal conjugate (PCV13) vaccine  You may need this if you have certain conditions and were not previously vaccinated. Pneumococcal polysaccharide (PPSV23) vaccine  You may need one or two doses if you smoke cigarettes or if you have certain conditions. Meningococcal conjugate (MenACWY) vaccine  You may need this if you have certain conditions. Hepatitis A vaccine  You may need this if you have certain conditions or if you travel or work in places where you may be exposed to hepatitis A. Hepatitis B vaccine  You may need this if you have certain conditions or if you travel or work in places where you may be exposed to hepatitis B. Haemophilus influenzae type b (Hib) vaccine  You may need this if you have certain risk factors. Human papillomavirus (HPV) vaccine  If recommended by your health care provider, you may need three doses over 6 months. You may receive vaccines as individual doses or as more than one vaccine together in one shot (combination vaccines). Talk with your health care provider about the risks and benefits of combination vaccines. What tests do I need? Blood tests  Lipid and cholesterol levels. These may be checked every 5 years, or more frequently if you are over 24 years old.  Hepatitis C test.  Hepatitis B test. Screening  Lung cancer screening. You may have this screening every year starting at age 57 if you have a 30-pack-year history of smoking and currently smoke or have quit within the past 15 years.  Prostate cancer screening. Recommendations will vary depending on your family history and  other risks.  Colorectal cancer screening. All adults should have this screening starting at age 62 and continuing until age 17. Your health care provider may recommend screening at age 47 if you are at increased risk. You will have tests every 1-10 years, depending on your results and the type of screening test.  Diabetes screening. This is done by  checking your blood sugar (glucose) after you have not eaten for a while (fasting). You may have this done every 1-3 years.  Sexually transmitted disease (STD) testing. Follow these instructions at home: Eating and drinking  Eat a diet that includes fresh fruits and vegetables, whole grains, lean protein, and low-fat dairy products.  Take vitamin and mineral supplements as recommended by your health care provider.  Do not drink alcohol if your health care provider tells you not to drink.  If you drink alcohol: ? Limit how much you have to 0-2 drinks a day. ? Be aware of how much alcohol is in your drink. In the [place] one drink equals one 12 oz bottle of beer (355 mL), one 5 oz glass of wine (148 mL), or one 1 oz glass of hard liquor (44 mL). Lifestyle  Take daily care of your teeth and gums.  Stay active. Exercise for at least 30 minutes on 5 or more days each week.  Do not use any products that contain nicotine or tobacco, such as cigarettes, e-cigarettes, and chewing tobacco. If you need help quitting, ask your health care provider.  If you are sexually active, practice safe sex. Use a condom or other form of protection to prevent STIs (sexually transmitted infections).  Talk with your health care provider about taking a low-dose aspirin every day starting at age 25. What's next?  Go to your health care provider once a year for a well check visit.  Ask your health care provider how often you should have your eyes and teeth checked.  Stay up to date on all vaccines. This information is not intended to replace advice given to you by your health care provider. Make sure you discuss any questions you have with your health care provider. Document Revised: 02/02/2018 Document Reviewed: 02/02/2018 Elsevier Patient Education  2020 [employer].

## 2019-09-21 NOTE — Progress Notes (Signed)
Subjective:    Patient ID: Gary Booth, male    DOB: 08/12/1970, 49 y.o.   MRN: [id number]  HPI  Pt in for cpe/wellness.  Pt bp is well controlled today. Hx of a1c 6.7 in the past. Since last visit has cut back on processed carbohydrates. Pt getting more exercise. Pt states now drinking less coffee as well compared to before.      Review of Systems  Constitutional: Negative for chills, fatigue and fever.  HENT: Negative for congestion and drooling.   Respiratory: Negative for cough, chest tightness, shortness of breath and wheezing.   Cardiovascular: Negative for chest pain and palpitations.  Gastrointestinal: Negative for abdominal pain.  Genitourinary: Negative for dysuria, flank pain, frequency, penile pain, testicular pain and urgency.  Musculoskeletal: Negative for back pain, joint swelling and neck pain.  Skin: Negative for rash.  Hematological: Negative for adenopathy. Does not bruise/bleed easily.  Psychiatric/Behavioral: Negative for behavioral problems, dysphoric mood and sleep disturbance. The patient is not nervous/anxious.    Past Medical History:  Diagnosis Date   Allergy    Chicken pox    Hyperlipidemia    Seasonal allergies      Social History   Socioeconomic History   Marital status: Married    Spouse name: Not on file   Number of children: Not on file   Years of education: Not on file   Highest education level: Not on file  Occupational History   Not on file  Tobacco Use   Smoking status: Former Smoker    Packs/day: 0.25    Years: 1.00    Pack years: 0.25    Quit date: 07/11/1988    Years since quitting: 31.2   Smokeless tobacco: Former Neurosurgeon    Types: Chew   Tobacco comment: 2. 5 years  Substance and Sexual Activity   Alcohol use: Yes    Alcohol/week: 0.0 standard drinks    Comment: occasional   Drug use: No   Sexual activity: Yes  Other Topics Concern   Not on file  Social History Narrative   Not on file    Social Determinants of Health   Financial Resource Strain:    Difficulty of Paying Living Expenses:   Food Insecurity:    Worried About Programme researcher, broadcasting/film/video in the Last Year:    Barista in the Last Year:   Transportation Needs:    Freight forwarder (Medical):    Lack of Transportation (Non-Medical):   Physical Activity:    Days of Exercise per Week:    Minutes of Exercise per Session:   Stress:    Feeling of Stress :   Social Connections:    Frequency of Communication with Friends and Family:    Frequency of Social Gatherings with Friends and Family:    Attends Religious Services:    Active Member of Clubs or Organizations:    Attends Engineer, structural:    Marital Status:   Intimate Partner Violence:    Fear of Current or Ex-Partner:    Emotionally Abused:    Physically Abused:    Sexually Abused:     Past Surgical History:  Procedure Laterality Date   BLEPHAROPLASTY  2010   left eye-lazy eye lid   FINGER SURGERY     Dislocation 4th - Left   TOE SURGERY     Right 2nd - bone spur   WISDOM TOOTH EXTRACTION      Family History  Problem Relation  Age of Onset   Arthritis Mother        Living   Arthritis Paternal Grandmother    Prostate cancer Father        Living   Alzheimer's disease Paternal Grandfather    Deep vein thrombosis Maternal Grandmother    Stroke Maternal Grandmother    Irritable bowel syndrome Brother    Crohn's disease Brother    Healthy Daughter     No Known Allergies  Current Outpatient Medications on File Prior to Visit  Medication Sig Dispense Refill   losartan-hydrochlorothiazide (HYZAAR) 100-12.5 MG tablet TAKE 1 TABLET BY MOUTH DAILY 30 tablet 2   No current facility-administered medications on file prior to visit.    BP 121/76    Pulse 61    Temp 97.9 F (36.6 C) (Oral)    Resp 18    Ht 5\' 9"  (1.753 m)    Wt (!) 220 lb 9.6 oz (100.1 kg)    SpO2 92%    BMI 32.58 kg/m        Objective:   Physical Exam   General Mental Status- Alert. General Appearance- Not in acute distress.   Skin General: Color- Normal Color. Moisture- Normal Moisture. No worrisome moles or lesions.  Neck Carotid Arteries- Normal color. Moisture- Normal Moisture. No carotid bruits. No JVD.  Chest and Lung Exam Auscultation: Breath Sounds:-Normal.  Cardiovascular Auscultation:Rythm- Regular. Murmurs & Other Heart Sounds:Auscultation of the heart reveals- No Murmurs.  Abdomen Inspection:-Inspeection Normal. Palpation/Percussion:Note:No mass. Palpation and Percussion of the abdomen reveal- Non Tender, Non Distended + BS, no rebound or guarding.    Neurologic Cranial Nerve exam:- CN III-XII intact(No nystagmus), symmetric smile. Finger to Nose:- Normal/Intact Strength:- 5/5 equal and symmetric strength both upper and lower extremities.  Rectal- declines.     Assessment & Plan:  For you wellness exam today I have ordered cbc, cmp, psa, a1c and  lipid panel.  Vaccines appear up to date. covid vaccine done.  Recommend continue  exercise and healthy/low sugar diet.  We will let you know lab results as they come in.  Follow up date appointment will be determined after lab review.   Referral to gi for colonoscopy.  Bp well controlled.   , PA-C

## 2019-12-24 ENCOUNTER — Encounter: Payer: Self-pay | Admitting: Medical

## 2019-12-25 ENCOUNTER — Other Ambulatory Visit: Payer: Self-pay | Admitting: Medical

## 2020-03-11 ENCOUNTER — Encounter: Payer: Self-pay | Admitting: Medical

## 2020-03-11 ENCOUNTER — Ambulatory Visit: Payer: Commercial Managed Care - PPO | Admitting: Medical

## 2020-03-11 ENCOUNTER — Other Ambulatory Visit: Payer: Self-pay

## 2020-03-11 ENCOUNTER — Ambulatory Visit (INDEPENDENT_AMBULATORY_CARE_PROVIDER_SITE_OTHER): Payer: Commercial Managed Care - PPO | Admitting: Medical

## 2020-03-11 VITALS — BP 129/79 | HR 73 | Temp 98.1°F | Resp 16 | Ht 69.0 in | Wt 222.2 lb

## 2020-03-11 DIAGNOSIS — I10 Essential (primary) hypertension: Secondary | ICD-10-CM

## 2020-03-11 DIAGNOSIS — R002 Palpitations: Secondary | ICD-10-CM

## 2020-03-11 DIAGNOSIS — R739 Hyperglycemia, unspecified: Secondary | ICD-10-CM

## 2020-03-11 LAB — HEMOGLOBIN A1C: Hgb A1c MFr Bld: 6.8 % — ABNORMAL HIGH (ref 4.6–6.5)

## 2020-03-11 LAB — LIPID PANEL
Cholesterol: 119 mg/dL (ref 0–200)
HDL: 37.9 mg/dL — ABNORMAL LOW (ref >39.00)
LDL Cholesterol: 58 mg/dL (ref 0–99)
NonHDL: 81.35
Total CHOL/HDL Ratio: 3
Triglycerides: 115 mg/dL (ref 0.0–149.0)
VLDL: 23 mg/dL (ref 0.0–40.0)

## 2020-03-11 LAB — COMPREHENSIVE METABOLIC PANEL
ALT: 38 U/L (ref 0–53)
AST: 34 U/L (ref 0–37)
Albumin: 4.6 g/dL (ref 3.5–5.2)
Alkaline Phosphatase: 82 U/L (ref 39–117)
BUN: 14 mg/dL (ref 6–23)
CO2: 30 mEq/L (ref 19–32)
Calcium: 9.5 mg/dL (ref 8.4–10.5)
Chloride: 102 mEq/L (ref 96–112)
Creatinine, Ser: 0.8 mg/dL (ref 0.40–1.50)
GFR: 103.02 mL/min (ref >60.00)
Glucose, Bld: 109 mg/dL — ABNORMAL HIGH (ref 70–99)
Potassium: 3.8 mEq/L (ref 3.5–5.1)
Sodium: 138 mEq/L (ref 135–145)
Total Bilirubin: 0.7 mg/dL (ref 0.2–1.2)
Total Protein: 6.8 g/dL (ref 6.0–8.3)

## 2020-03-11 NOTE — Progress Notes (Signed)
Subjective:    Patient ID: Gary Booth, male    DOB: [birth year], 50 y.o.   MRN: [id number]  HPI  Pt has recent very brief palpitation that are occurring about 1-2 times a day for a second over past 7-10 days. However over the holidays he states was having about 5-6 times a day. No chest pain or shortness of breath.   Most morning drinks 14 oz cup of coffee. Palpitation throughout the day.  ekg done last year nsr.  Pt around holiday wonder whether he needed to see cardiologist.  Pt has htn on hyzaar.  Hx of high sugar in past.  Lipid panel showed low hdl.  In November he got covid booster.   Review of Systems  Constitutional: Negative for chills, fatigue and fever.  HENT: Negative for congestion.   Respiratory: Negative for cough, chest tightness, shortness of breath and wheezing.   Cardiovascular: Negative for chest pain and palpitations.       None today yet but has had daily.   Gastrointestinal: Negative for abdominal pain.  Musculoskeletal: Negative for back pain.  Skin: Negative for rash.  Neurological: Negative for dizziness, seizures, weakness and headaches.  Hematological: Negative for adenopathy. Does not bruise/bleed easily.  Psychiatric/Behavioral: Negative for behavioral problems.    Past Medical History:  Diagnosis Date  . Allergy   . Chicken pox   . Hyperlipidemia   . Seasonal allergies      Social History   Socioeconomic History  . Marital status: Married    Spouse name: Not on file  . Number of children: Not on file  . Years of education: Not on file  . Highest education level: Not on file  Occupational History  . Not on file  Tobacco Use  . Smoking status: Former Smoker    Packs/day: 0.25    Years: 1.00    Pack years: 0.25    Quit date: 07/11/1988    Years since quitting: 31.6  . Smokeless tobacco: Former Neurosurgeon    Types: Chew  . Tobacco comment: 2. 5 years  Substance and Sexual Activity  . Alcohol use: Yes    Alcohol/week: 0.0  standard drinks    Comment: occasional  . Drug use: No  . Sexual activity: Yes  Other Topics Concern  . Not on file  Social History Narrative  . Not on file   Social Determinants of Health   Financial Resource Strain: Not on file  Food Insecurity: Not on file  Transportation Needs: Not on file  Physical Activity: Not on file  Stress: Not on file  Social Connections: Not on file  Intimate Partner Violence: Not on file    Past Surgical History:  Procedure Laterality Date  . BLEPHAROPLASTY  2010   left eye-lazy eye lid  . FINGER SURGERY     Dislocation 4th - Left  . TOE SURGERY     Right 2nd - bone spur  . WISDOM TOOTH EXTRACTION      Family History  Problem Relation Age of Onset  . Arthritis Mother        Living  . Arthritis Paternal Grandmother   . Prostate cancer Father        Living  . Alzheimer's disease Paternal Grandfather   . Deep vein thrombosis Maternal Grandmother   . Stroke Maternal Grandmother   . Irritable bowel syndrome Brother   . Crohn's disease Brother   . Healthy Daughter     No Known Allergies  Current Outpatient  Medications on File Prior to Visit  Medication Sig Dispense Refill  . losartan-hydrochlorothiazide (HYZAAR) 100-12.5 MG tablet Take 1 tablet by mouth daily. 90 tablet 0   No current facility-administered medications on file prior to visit.    There were no vitals taken for this visit.      Objective:   Physical Exam  General Mental Status- Alert. General Appearance- Not in acute distress.   Skin General: Color- Normal Color. Moisture- Normal Moisture.  Neck Carotid Arteries- Normal color. Moisture- Normal Moisture. No carotid bruits. No JVD.  Chest and Lung Exam Auscultation: Breath Sounds:-Normal.  Cardiovascular Auscultation:Rythm- Regular. Murmurs & Other Heart Sounds:Auscultation of the heart reveals- No Murmurs.  Abdomen Inspection:-Inspeection Normal. Palpation/Percussion:Note:No mass. Palpation and  Percussion of the abdomen reveal- Non Tender, Non Distended + BS, no rebound or guarding.   Neurologic Cranial Nerve exam:- CN III-XII intact(No nystagmus), symmetric smile. Strength:- 5/5 equal and symmetric strength both upper and lower extremities.      Assessment & Plan:  Hx of htn. BP controlled today. Continue hyzaar.  For elevated sugar in past will get 3 month sugar average test today.  For palpitation we did ekg today. Ekg showed normal sinus rhythm.  Will go ahead and refer to cardiologist for evaluation/probable zio patch.  Avoid caffeine.  If prior to cardiologist severe palpitation, tachycardia or chest pain then be seen in ED.  Follow up with cardiologist. With our office approximate 3-6 months based on lab results or sooner if needed   Esperanza Richters, PA-C  Talked to pt early in day. Then had him come in office for vitals ekg and discussed plan again.

## 2020-03-11 NOTE — Addendum Note (Signed)
Addended by: Gwenevere Abbot on: 03/11/2020 01:33 PM   Modules accepted: Orders, Level of Service

## 2020-03-11 NOTE — Patient Instructions (Addendum)
Hx of htn. BP controlled today. Continue hyzaar.  For elevated sugar in past will get 3 month sugar average test today.  For palpitation we did ekg today. Ekg showed normal sinus rhythm.  Will go ahead and refer to cardiologist for evaluation/probable zio patch.  Avoid caffeine.  If prior to cardiologist severe palpitation, tachycardia or chest pain then be seen in ED.  Follow up with cardiologist. With our office approximate 3-6 months based on lab results or sooner if needed

## 2020-03-28 ENCOUNTER — Ambulatory Visit: Payer: Commercial Managed Care - PPO | Admitting: Cardiology

## 2020-03-28 ENCOUNTER — Encounter: Payer: Self-pay | Admitting: Cardiology

## 2020-03-28 ENCOUNTER — Other Ambulatory Visit: Payer: Self-pay

## 2020-03-28 VITALS — BP 128/90 | HR 65 | Ht 69.5 in | Wt 227.0 lb

## 2020-03-28 DIAGNOSIS — I10 Essential (primary) hypertension: Secondary | ICD-10-CM | POA: Diagnosis not present

## 2020-03-28 DIAGNOSIS — R002 Palpitations: Secondary | ICD-10-CM | POA: Diagnosis not present

## 2020-03-28 NOTE — Progress Notes (Signed)
Cardiology Office Note:    Date:  03/28/2020   ID:  Gary Booth, DOB 05/20/1970, MRN [id number]  PCP:  Esperanza Richters, PA-C  Cardiologist:  Jodelle Red, MD  Referring MD: Marisue Brooklyn   CC: new consult for palpitations  History of Present Illness:    Gary Booth is a 50 y.o. male with a hx of hypertension who is seen as a new consult at the request of Saguier, Kateri Mc for the evaluation and management of palpitations.  Note reviewed from [employer] dated 03-11-20. Noted very brief palpitations occurring several times/day.  Tachycardia/palpitations: -Initial onset: feels like a skipped beat/flutter. Noted palpitations were bad in 01/2019, better when he started medications for high blood pressure. Then over the holidays 01/2020, started back up in a more minor way. Worried it was related to his Covid booster. -Frequency/Duration: Very brief individual episodes, seconds. Had been multiple times/hour at the peak. Has not occurred recently. -Associated symptoms: none -Aggravating/alleviating factors: better slightly with lowering amount of coffee in the AM -Syncope/near syncope: none -Prior cardiac history: hypertension -Prior workup: none -Prior treatment: none -Possible medication interactions: better since starting antihypertensives -Caffeine: none -Alcohol: very rare use, about 1/month -Tobacco: former, minor smoker in the [employer] for 1 year -OTC supplements: none -Exercise level: no limitations -Labs: kidney function/electrolytes, CBC reviewed. No recent thyroid that I can see -Cardiac ROS: no chest pain, no shortness of breath, no PND, no orthopnea, no LE edema. -Family history: mat Gpa had pacemaker, mat uncle also with pacemaker. No known history on paternal side.  Past Medical History:  Diagnosis Date  . Allergy   . Chicken pox   . Hyperlipidemia   . Seasonal allergies     Past Surgical History:  Procedure Laterality Date   . BLEPHAROPLASTY  2010   left eye-lazy eye lid  . FINGER SURGERY     Dislocation 4th - Left  . TOE SURGERY     Right 2nd - bone spur  . WISDOM TOOTH EXTRACTION      Current Medications: Current Outpatient Medications on File Prior to Visit  Medication Sig  . losartan-hydrochlorothiazide (HYZAAR) 100-12.5 MG tablet Take 1 tablet by mouth daily.   No current facility-administered medications on file prior to visit.     Allergies:   Patient has no known allergies.   Social History   Tobacco Use  . Smoking status: Former Smoker    Packs/day: 0.25    Years: 1.00    Pack years: 0.25    Quit date: 07/11/1988    Years since quitting: 31.7  . Smokeless tobacco: Former Neurosurgeon    Types: Chew  . Tobacco comment: 2. 5 years  Substance Use Topics  . Alcohol use: Yes    Alcohol/week: 0.0 standard drinks    Comment: occasional  . Drug use: No    Family History: family history includes Alzheimer's disease in his paternal grandfather; Arthritis in his mother and paternal grandmother; Crohn's disease in his brother; Deep vein thrombosis in his maternal grandmother; Healthy in his daughter; Irritable bowel syndrome in his brother; Prostate cancer in his father; Stroke in his maternal grandmother.  ROS:   Please see the history of present illness.  Additional pertinent ROS: Constitutional: Negative for chills, fever, night sweats, unintentional weight loss  HENT: Negative for ear pain and hearing loss.   Eyes: Negative for loss of vision and eye pain.  Respiratory: Negative for cough, sputum, wheezing.   Cardiovascular: See HPI. Gastrointestinal: Negative for  abdominal pain, melena, and hematochezia.  Genitourinary: Negative for dysuria and hematuria.  Musculoskeletal: Negative for falls and myalgias.  Skin: Negative for itching and rash.  Neurological: Negative for focal weakness, focal sensory changes and loss of consciousness.  Endo/Heme/Allergies: Does not bruise/bleed easily.      EKGs/Labs/Other Studies Reviewed:    The following studies were reviewed today: No prior cardiac studies  EKG:  EKG is personally reviewed.  The ekg ordered today demonstrates SR with sinus arrhythmia at 65 bpm  Recent Labs: 09/21/2019: Hemoglobin 15.5; Platelets 269.0 03/11/2020: ALT 38; BUN 14; Creatinine, Ser 0.80; Potassium 3.8; Sodium 138  Recent Lipid Panel    Component Value Date/Time   CHOL 119 03/11/2020 1332   TRIG 115.0 03/11/2020 1332   HDL 37.90 (L) 03/11/2020 1332   CHOLHDL 3 03/11/2020 1332   VLDL 23.0 03/11/2020 1332   LDLCALC 58 03/11/2020 1332    Physical Exam:    VS:  BP 128/90 (BP Location: Left Arm, Patient Position: Sitting)   Pulse 65   Ht 5' 9.5" (1.765 m)   Wt 227 lb (103 kg)   SpO2 95%   BMI 33.04 kg/m     Wt Readings from Last 3 Encounters:  03/28/20 227 lb (103 kg)  03/11/20 222 lb 3.2 oz (100.8 kg)  09/21/19 (!) 220 lb 9.6 oz (100.1 kg)    GEN: Well nourished, well developed in no acute distress HEENT: Normal, moist mucous membranes NECK: No JVD CARDIAC: regular rhythm, normal S1 and S2, no rubs or gallops. No murmur. VASCULAR: Radial and DP pulses 2+ bilaterally. No carotid bruits RESPIRATORY:  Clear to auscultation without rales, wheezing or rhonchi  ABDOMEN: Soft, non-tender, non-distended MUSCULOSKELETAL:  Ambulates independently SKIN: Warm and dry, no edema NEUROLOGIC:  Alert and oriented x 3. No focal neuro deficits noted. PSYCHIATRIC:  Normal affect    ASSESSMENT:    1. Palpitation   2. Essential hypertension    PLAN:    Palptiations: -we discussed the potential causes of fast heart rates and palpitations today. Reviewed the normal electrical system of the heart. Reviewed the role of the sinus node. Reviewed the balance between resting (vagal) tone and fight or flight nervous system input. Reviewed how exercise improves vagal tone and lowers resting heart rate. Reviewed that sinus tachycardia, or elevated sinus rate, is  usually secondary to something else in the body. This can include pain, stress, infection, anxiety, hormone imbalance, low blood counts, etc. Discussed that we do not typically treat sinus tachycardia by itself, and instead the focus is on finding what is driving the heart rate and treating that. We discussed that there can be other rhythm issues, from either the top or bottom of the heart, that are abnormal rhythms. Discussed how we evaluate for these.  Hypertension: well controlled today -continue losartan-HCTZ  Cardiac risk assessment  -ASCVD risk score: The ASCVD Risk score Denman George DC Jr., et al., 2013) failed to calculate for the following reasons:   The valid total cholesterol range is 130 to 320 mg/dL    Plan for follow up: as needed  Jodelle Red, MD, PhD, [facility]  [facility]    Medication Adjustments/Labs and Tests Ordered: Current medicines are reviewed at length with the patient today.  Concerns regarding medicines are outlined above.  Orders Placed This Encounter  Procedures  . EKG 12-Lead   No orders of the defined types were placed in this encounter.   Patient Instructions  Medication Instructions:  Your Physician recommend  you continue on your current medication as directed.    *If you need a refill on your cardiac medications before your next appointment, please call your pharmacy*   Lab Work: None   Testing/Procedures: None   Follow-Up: At [facility], you and your health needs are our priority.  As part of our continuing mission to provide you with exceptional heart care, we have created designated Provider Care Teams.  These Care Teams include your primary Cardiologist (physician) and Advanced Practice Providers (APPs -  Physician Assistants and Nurse Practitioners) who all work together to provide you with the care you need, when you need it.  We recommend signing up for the patient portal called "MyChart".  Sign up information is  provided on this After Visit Summary.  MyChart is used to connect with patients for Virtual Visits (Telemedicine).  Patients are able to view lab/test results, encounter notes, upcoming appointments, etc.  Non-urgent messages can be sent to your provider as well.   To learn more about what you can do with MyChart, go to [email or web address].    Your next appointment:   As needed  The format for your next appointment:   In Person  Provider:   Jodelle Red, MD   Other Instructions If these become more frequent, call and we will order a monitor. If they are infrequent and last longer, look into a [employer].   Signed, Jodelle Red, MD PhD 03/28/2020 10:34 AM    [facility]

## 2020-03-28 NOTE — Patient Instructions (Addendum)
Medication Instructions:  Your Physician recommend you continue on your current medication as directed.    *If you need a refill on your cardiac medications before your next appointment, please call your pharmacy*   Lab Work: None   Testing/Procedures: None   Follow-Up: At [facility], you and your health needs are our priority.  As part of our continuing mission to provide you with exceptional heart care, we have created designated Provider Care Teams.  These Care Teams include your primary Cardiologist (physician) and Advanced Practice Providers (APPs -  Physician Assistants and Nurse Practitioners) who all work together to provide you with the care you need, when you need it.  We recommend signing up for the patient portal called "MyChart".  Sign up information is provided on this After Visit Summary.  MyChart is used to connect with patients for Virtual Visits (Telemedicine).  Patients are able to view lab/test results, encounter notes, upcoming appointments, etc.  Non-urgent messages can be sent to your provider as well.   To learn more about what you can do with MyChart, go to [email or web address].    Your next appointment:   As needed  The format for your next appointment:   In Person  Provider:   Jodelle Red, MD   Other Instructions If these become more frequent, call and we will order a monitor. If they are infrequent and last longer, look into a [employer].

## 2020-06-17 ENCOUNTER — Telehealth: Payer: Commercial Managed Care - PPO | Admitting: Nurse Practitioner

## 2020-06-17 DIAGNOSIS — L03116 Cellulitis of left lower limb: Secondary | ICD-10-CM

## 2020-06-17 MED ORDER — CEPHALEXIN 500 MG PO CAPS: 500.0000 mg | ORAL_CAPSULE | Freq: Four times a day (QID) | ORAL | 0 refills | Status: AC

## 2020-06-17 NOTE — Progress Notes (Signed)
E Visit for Cellulitis  We are sorry that you are not feeling well. Here is how we plan to help!  Based on what you shared with me it looks like you have cellulitis.  Cellulitis looks like areas of skin redness, swelling, and warmth; it develops as a result of bacteria entering under the skin. Little red spots and/or bleeding can be seen in skin, and tiny surface sacs containing fluid can occur. Fever can be present. Cellulitis is almost always on one side of a body, and the lower limbs are the most common site of involvement.   Approximately 5 minutes were spent reviewing the patient's chart, history, photos, and documenting on the patient.   Meds ordered this encounter  Medications  . cephALEXin (KEFLEX) 500 MG capsule    Sig: Take 1 capsule (500 mg total) by mouth 4 (four) times daily for 10 days.    Dispense:  40 capsule    Refill:  0    I have prescribed:  Keflex 500mg  take one by mouth four times a day for 5 days  HOME CARE:  . Take your medications as ordered and take all of them, even if the skin irritation appears to be healing.   GET HELP RIGHT AWAY IF:  . Symptoms that don't begin to go away within 48 hours. . Severe redness persists or worsens . If the area turns color, spreads or swells. . If it blisters and opens, develops yellow-brown crust or bleeds. . You develop a fever or chills. . If the pain increases or becomes unbearable.  . Are unable to keep fluids and food down.  MAKE SURE YOU    Understand these instructions.  Will watch your condition.  Will get help right away if you are not doing well or get worse.  Thank you for choosing an e-visit. Your e-visit answers were reviewed by a board certified advanced clinical practitioner to complete your personal care plan. Depending upon the condition, your plan could have included both over the counter or prescription medications. Please review your pharmacy choice. Make sure the pharmacy is open so you can  pick up prescription now. If there is a problem, you may contact your provider through and have the prescription routed to another pharmacy. Your safety is important to [employer]. If you have drug allergies check your prescription carefully.  For the next 24 hours you can use MyChart to ask questions about today's visit, request a non-urgent call back, or ask for a work or school excuse. You will get an email in the next two days asking about your experience. I hope that your e-visit has been valuable and will speed your recovery.

## 2020-12-07 ENCOUNTER — Other Ambulatory Visit: Payer: Self-pay | Admitting: Medical

## 2021-06-05 ENCOUNTER — Other Ambulatory Visit: Payer: Self-pay | Admitting: Medical

## 2021-07-08 ENCOUNTER — Ambulatory Visit
Admission: RE | Admit: 2021-07-08 | Discharge: 2021-07-08 | Disposition: A | Discharge status: Home / Self Care | Payer: Commercial Managed Care - PPO | Source: Ambulatory Visit | Attending: Specialist | Admitting: Specialist

## 2021-07-08 ENCOUNTER — Other Ambulatory Visit: Payer: Self-pay | Admitting: Specialist

## 2021-07-08 DIAGNOSIS — M542 Cervicalgia: Secondary | ICD-10-CM

## 2021-09-07 ENCOUNTER — Other Ambulatory Visit: Payer: Self-pay | Admitting: Orthopedic Surgery

## 2021-09-07 DIAGNOSIS — M542 Cervicalgia: Secondary | ICD-10-CM

## 2021-09-23 ENCOUNTER — Ambulatory Visit
Admission: RE | Admit: 2021-09-23 | Discharge: 2021-09-23 | Disposition: A | Discharge status: Home / Self Care | Payer: Commercial Managed Care - PPO | Source: Ambulatory Visit | Attending: Orthopedic Surgery | Admitting: Orthopedic Surgery

## 2021-09-23 DIAGNOSIS — M542 Cervicalgia: Secondary | ICD-10-CM

## 2021-11-25 ENCOUNTER — Other Ambulatory Visit: Payer: Self-pay | Admitting: Medical

## 2022-03-17 ENCOUNTER — Telehealth: Payer: Self-pay | Admitting: Cardiology

## 2022-03-17 ENCOUNTER — Encounter: Payer: Self-pay | Admitting: Medical

## 2022-03-17 ENCOUNTER — Telehealth (HOSPITAL_BASED_OUTPATIENT_CLINIC_OR_DEPARTMENT_OTHER): Payer: Self-pay | Admitting: *Deleted

## 2022-03-17 NOTE — Telephone Encounter (Signed)
Patient c/o Palpitations:  High priority if patient c/o lightheadedness, shortness of breath, or chest pain  How long have you had palpitations/irregular HR/ Afib? Are you having the symptoms now? 3 days, yes  Are you currently experiencing lightheadedness, SOB or CP? No  Do you have a history of afib (atrial fibrillation) or irregular heart rhythm? yes  Have you checked your BP or HR? (document readings if available): BP yesterday was fine  Are you experiencing any other symptoms? No   Patient states he has been having palpitation for the last 3 days. He says Monday was the worst and today it is better, but still there.

## 2022-03-17 NOTE — Telephone Encounter (Signed)
Left message offering patient appt 03/18/22 at 9:15 am with Laurann Montana, NP (pt scheduled 03/23/22 and wanted sooner appt)

## 2022-03-17 NOTE — Telephone Encounter (Signed)
Spoke with patient regarding "palpitations" Was seen by Dr Harrell Gave last 2022  Monday palpitations started more frequent than ever and he didn't feel well Tuesday and today continues to have frequent palpitations/flutter but feeling better Was told by his PCP in past his A1C was borderline and recommended starting medication but he declined and chose to work on diet. Thinks he last saw PCP in 2022 On Monday he started checking his blood sugar and it was around 260, yesterday was above 300 but came down to 160's. Per patient has been running in 200's all week  Advised patient he needed to contact his PCP as soon as we got off phone regarding blood sugars  Denies heart racing or chest pains, did have some shortness of breath Monday with exertion but subsided with rest, ok since  Scheduled appointment with Overton Mam NP for Monday, first available between [facility], and [facility]. Will have him added to cancellation list  Advised patient if fast HR, shortness of breath, chest pains, or blood sugars above 300 go to ED for evaluation  Again advised patient to call PCP as soon as we finish call   Will forward to Overton Mam NP and Dr Harrell Gave for reviw

## 2022-03-17 NOTE — Telephone Encounter (Signed)
Agree with plan for office visit. Can consider monitor at time of office visit. General recommendations for palpitations below. Agree with plan to follow up with PCP regarding blood sugar .  To prevent palpitations: Make sure adequately hydrated.  Avoid and/or limit caffeine containing beverages like soda or tea. Exercise regularly.  Manage stress well. Some over the counter medications can cause palpitations such as Benadryl, AdvilPM, TylenolPM. Regular Advil or Tylenol do not cause palpitations.    Loel Dubonnet, NP

## 2022-03-17 NOTE — Telephone Encounter (Signed)
Did review recommendations with patient when spoke to him but also sent mychart message

## 2022-03-19 ENCOUNTER — Ambulatory Visit: Payer: Commercial Managed Care - PPO | Admitting: Medical

## 2022-03-19 ENCOUNTER — Other Ambulatory Visit (HOSPITAL_BASED_OUTPATIENT_CLINIC_OR_DEPARTMENT_OTHER): Payer: Self-pay

## 2022-03-19 VITALS — BP 126/74 | HR 66 | Resp 18 | Ht 69.5 in | Wt 218.0 lb

## 2022-03-19 DIAGNOSIS — R002 Palpitations: Secondary | ICD-10-CM | POA: Diagnosis not present

## 2022-03-19 DIAGNOSIS — E119 Type 2 diabetes mellitus without complications: Secondary | ICD-10-CM

## 2022-03-19 DIAGNOSIS — I4891 Unspecified atrial fibrillation: Secondary | ICD-10-CM | POA: Diagnosis not present

## 2022-03-19 DIAGNOSIS — Z125 Encounter for screening for malignant neoplasm of prostate: Secondary | ICD-10-CM

## 2022-03-19 MED ORDER — APIXABAN 5 MG PO TABS
5.0000 mg | ORAL_TABLET | Freq: Two times a day (BID) | ORAL | 0 refills | Status: DC
  Filled 2022-03-19: qty 60, 30d supply, fill #0

## 2022-03-19 MED ORDER — METOPROLOL SUCCINATE ER 25 MG PO TB24
25.0000 mg | ORAL_TABLET | Freq: Every day | ORAL | 3 refills | Status: DC
  Filled 2022-03-19: qty 30, 30d supply, fill #0
  Filled 2022-04-12: qty 30, 30d supply, fill #1
  Filled 2022-05-11: qty 30, 30d supply, fill #2
  Filled 2022-06-10 – 2022-06-19 (×2): qty 30, 30d supply, fill #3

## 2022-03-19 NOTE — Progress Notes (Addendum)
Subjective:    Patient ID: Gary Booth, male    DOB: August 26, 1970, 52 y.o.   MRN: [id number]  HPI Pt in for evaluation.  Pt states was walking into work on Monday  and felt sensation of irregular heart beat. He felt little winded and slight dizzy. He took a break and rested. He felt better but still occasional slight intermittent irregular beat during the day.  On Monday describes feeling fatigued all day but since energy recovered.   He described that since Monday will feel like sporadic skipped beat and then will beat little fast to catch up. Does drink 14 oz of coffee a day.   Middle of the week he states walked and did not have any symptoms. No chest pain.   Pt has appointment with cardiologist on Monday. Schedule with NP Caitlyn works with Ferdinand Cava MD.    Diabetes- sugars when he checks have been in 200 range. Last saw pt in 03/11/2020.   Review of Systems  Constitutional:  Negative for chills, fatigue and fever.  HENT:  Negative for congestion and drooling.   Respiratory:  Negative for chest tightness and wheezing.        See hpi.  Cardiovascular:  Positive for palpitations. Negative for chest pain.       See hpi.  Gastrointestinal:  Negative for abdominal pain and blood in stool.  Genitourinary:  Negative for dysuria and flank pain.  Musculoskeletal:  Negative for back pain and gait problem.  Neurological:  Negative for syncope, facial asymmetry, weakness, numbness and headaches.  Hematological:  Negative for adenopathy. Does not bruise/bleed easily.    Past Medical History:  Diagnosis Date   Allergy    Chicken pox    Hyperlipidemia    Seasonal allergies      Social History   Socioeconomic History   Marital status: Married    Spouse name: Not on file   Number of children: Not on file   Years of education: Not on file   Highest education level: Not on file  Occupational History   Not on file  Tobacco Use   Smoking status: Former     Packs/day: 0.25    Years: 1.00    Total pack years: 0.25    Types: Cigarettes    Quit date: 07/11/1988    Years since quitting: 33.7   Smokeless tobacco: Former    Types: Chew   Tobacco comments:    2. 5 years  Substance and Sexual Activity   Alcohol use: Yes    Alcohol/week: 0.0 standard drinks of alcohol    Comment: occasional   Drug use: No   Sexual activity: Yes  Other Topics Concern   Not on file  Social History Narrative   Not on file   Social Determinants of Health   Financial Resource Strain: Not on file  Food Insecurity: Not on file  Transportation Needs: Not on file  Physical Activity: Not on file  Stress: Not on file  Social Connections: Not on file  Intimate Partner Violence: Not on file    Past Surgical History:  Procedure Laterality Date   BLEPHAROPLASTY  2010   left eye-lazy eye lid   FINGER SURGERY     Dislocation 4th - Left   TOE SURGERY     Right 2nd - bone spur   WISDOM TOOTH EXTRACTION      Family History  Problem Relation Age of Onset   Arthritis Mother  Living   Arthritis Paternal Grandmother    Prostate cancer Father        Living   Alzheimer's disease Paternal Grandfather    Deep vein thrombosis Maternal Grandmother    Stroke Maternal Grandmother    Irritable bowel syndrome Brother    Crohn's disease Brother    Healthy Daughter     Allergies  Allergen Reactions   June Grass Pollen Standardized [Timothy Grass Pollen Allergen]     Current Outpatient Medications on File Prior to Visit  Medication Sig Dispense Refill   losartan-hydrochlorothiazide (HYZAAR) 100-12.5 MG tablet TAKE 1 TABLET DAILY 90 tablet 1   No current facility-administered medications on file prior to visit.    BP 126/74   Pulse 66   Resp 18   Ht 5' 9.5" (1.765 m)   Wt 218 lb (98.9 kg)   SpO2 97%   BMI 31.73 kg/m        Objective:   Physical Exam  General Mental Status- Alert. General Appearance- Not in acute distress.   Skin General:  Color- Normal Color. Moisture- Normal Moisture.  Neck Carotid Arteries- Normal color. Moisture- Normal Moisture. No carotid bruits. No JVD.  Chest and Lung Exam Auscultation: Breath Sounds:-Normal.  Cardiovascular Auscultation:Rythm- Regular. Murmurs & Other Heart Sounds:Auscultation of the heart reveals- No Murmurs.  Abdomen Inspection:-Inspeection Normal. Palpation/Percussion:Note:No mass. Palpation and Percussion of the abdomen reveal- Non Tender, Non Distended + BS, no rebound or guarding.   Neurologic Cranial Nerve exam:- CN III-XII intact(No nystagmus), symmetric smile. Strength:- 5/5 equal and symmetric strength both upper and lower extremities.   Lower ext- no pedal edema. Negative homans sign.     Assessment & Plan:   Patient Instructions  New onset atrial fibrillation. Appears started on Monday. No chest pain. Currently rate controlled. Not symptomatic presently. Discussed with cardiologist at [facility].   Rx metoprolol ER.25 mg daily. Rx Eliquis 5 mg bid   If you become symptomatic palpiation, racing heart rate, dizzy, dyspnea/sob or chest pain be seen in the ED.  For diabetes get cmp and A1c today. After lab review probably re prescribe metformin.  Follow up date to be determined after lab review.     Mackie Pai, PA-C    Time spent with patient today was  52 minutes which consisted of chart review, discussing diagnosis, work up, treatment, discussed case with caridiologist,  and documentation. Answered all of pt questions. Advise stop drinking caffeine beverages.

## 2022-03-19 NOTE — Addendum Note (Signed)
Addended by: Anabel Halon on: 03/19/2022 04:07 PM   Modules accepted: Orders

## 2022-03-19 NOTE — Addendum Note (Signed)
Addended by: Manuela Schwartz on: 03/19/2022 04:10 PM   Modules accepted: Orders

## 2022-03-19 NOTE — Addendum Note (Signed)
Addended by: Anabel Halon on: 03/19/2022 04:06 PM   Modules accepted: Orders

## 2022-03-19 NOTE — Patient Instructions (Addendum)
New onset atrial fibrillation. Appears started on Monday. No chest pain. Currently rate controlled. Not symptomatic presently. Discussed with cardiologist at [facility].   Rx metoprolol ER.25 mg daily. Rx Eliquis 5 mg bid   EKG Atrial fibrillation. Rate controled at 98.  If you become symptomatic palpation, racing heart rate, dizzy, dyspnea/sob, neurologic symptoms or chest pain be seen in the ED.  For diabetes get cmp and A1c today. After lab review probably re prescribe metformin.  Follow up date to be determined after lab review.

## 2022-03-20 LAB — COMPREHENSIVE METABOLIC PANEL
AG Ratio: 1.9 (calc) (ref 1.0–2.5)
ALT: 51 U/L — ABNORMAL HIGH (ref 9–46)
AST: 30 U/L (ref 10–35)
Albumin: 4.4 g/dL (ref 3.6–5.1)
Alkaline phosphatase (APISO): 90 U/L (ref 35–144)
BUN: 15 mg/dL (ref 7–25)
CO2: 22 mmol/L (ref 20–32)
Calcium: 9.2 mg/dL (ref 8.6–10.3)
Chloride: 101 mmol/L (ref 98–110)
Creat: 0.76 mg/dL (ref 0.70–1.30)
Globulin: 2.3 g/dL (calc) (ref 1.9–3.7)
Glucose, Bld: 164 mg/dL — ABNORMAL HIGH (ref 65–99)
Potassium: 3.9 mmol/L (ref 3.5–5.3)
Sodium: 137 mmol/L (ref 135–146)
Total Bilirubin: 0.6 mg/dL (ref 0.2–1.2)
Total Protein: 6.7 g/dL (ref 6.1–8.1)

## 2022-03-20 LAB — HEMOGLOBIN A1C
Hgb A1c MFr Bld: 8.6 % of total Hgb — ABNORMAL HIGH (ref <5.7)
Mean Plasma Glucose: 200 mg/dL
eAG (mmol/L): 11.1 mmol/L

## 2022-03-20 LAB — PSA: PSA: 0.37 ng/mL (ref <=4.00)

## 2022-03-20 MED ORDER — METFORMIN HCL 500 MG PO TABS: 500.0000 mg | ORAL_TABLET | Freq: Two times a day (BID) | ORAL | 3 refills | Status: DC

## 2022-03-20 NOTE — Addendum Note (Signed)
Addended by: Anabel Halon on: 03/20/2022 11:15 AM   Modules accepted: Orders

## 2022-03-22 ENCOUNTER — Ambulatory Visit (HOSPITAL_BASED_OUTPATIENT_CLINIC_OR_DEPARTMENT_OTHER): Payer: Commercial Managed Care - PPO | Admitting: Family

## 2022-03-22 ENCOUNTER — Telehealth (HOSPITAL_BASED_OUTPATIENT_CLINIC_OR_DEPARTMENT_OTHER): Payer: Self-pay

## 2022-03-22 ENCOUNTER — Encounter (HOSPITAL_BASED_OUTPATIENT_CLINIC_OR_DEPARTMENT_OTHER): Payer: Self-pay | Admitting: Family

## 2022-03-22 VITALS — BP 112/80 | HR 80 | Ht 69.5 in | Wt 220.0 lb

## 2022-03-22 DIAGNOSIS — I10 Essential (primary) hypertension: Secondary | ICD-10-CM | POA: Diagnosis not present

## 2022-03-22 DIAGNOSIS — D6859 Other primary thrombophilia: Secondary | ICD-10-CM

## 2022-03-22 DIAGNOSIS — I4891 Unspecified atrial fibrillation: Secondary | ICD-10-CM

## 2022-03-22 DIAGNOSIS — E1165 Type 2 diabetes mellitus with hyperglycemia: Secondary | ICD-10-CM

## 2022-03-22 NOTE — Telephone Encounter (Addendum)
Called patient and updated him about labs, he is agreeable to labs one week prior to TEE/Cardioversion. LABS ORDERED AND MAILED TO PATIENT.   ----- Message from Loel Dubonnet, NP sent at 03/22/2022  1:09 PM EST ----- CBC/BMP 1 week prior to TEE/cardioversion

## 2022-03-22 NOTE — H&P (View-Only) (Signed)
Office Visit    Patient Name: Gary Booth Date of Encounter: 03/22/2022  PCP:  Saguier, Edward, [employer]  Cardiologist:  Buford Dresser, MD  Advanced Practice Provider:  No care team member to display Electrophysiologist:  None      Chief Complaint    Gary Booth is a 52 y.o. male presents today for atrial fibrillation   Past Medical History    Past Medical History:  Diagnosis Date   Allergy    Chicken pox    Hyperlipidemia    Seasonal allergies    Past Surgical History:  Procedure Laterality Date   BLEPHAROPLASTY  2010   left eye-lazy eye lid   FINGER SURGERY     Dislocation 4th - Left   TOE SURGERY     Right 2nd - bone spur   WISDOM TOOTH EXTRACTION      Allergies  Allergies  Allergen Reactions   June Grass Pollen Standardized [Timothy Grass Pollen Allergen]     History of Present Illness    Gary Booth is a 52 y.o. male with a hx of hypertension, diabetes type 2, atrial fibrillation last seen 03/28/2020 by Dr. Harrell Gave.  He was seen 03/28/2020 with intermittent palpitations.  They have not occurred recently and as such monitoring of symptoms was recommended.  He contacted the office 03/17/2022 noting recurrent palpitations.  He saw primary care 03/19/2022 and was found of new onset atrial fibrillation.  Metoprolol succinate 25 mg daily and Eliquis 5 mg twice daily were initiated.  He felt onset was 03/15/2022.  Labs 03/19/2022 revealed A1c of 8.6 and metformin was initiated.  Presents today for follow up with his wife. He notes Monday 03/15/22 felt a bit lightheaded while noticing his heart rate was irregular. He felt more fatigued. It gradually has improved but still aware of irregular  beat and decreased activity tolerance. No chest pain, pressure, tightness. No recurrent LE edema since addition of HCTZ to medication regimen.   EKGs/Labs/Other Studies Reviewed:   The following studies were  reviewed today:   EKG:  EKG is ordered today.  The ekg ordered today demonstrates rate controlled atrial fibrillation 80 bpm  Recent Labs: 03/19/2022: ALT 51; BUN 15; Creat 0.76; Potassium 3.9; Sodium 137  Recent Lipid Panel    Component Value Date/Time   CHOL 119 03/11/2020 1332   TRIG 115.0 03/11/2020 1332   HDL 37.90 (L) 03/11/2020 1332   CHOLHDL 3 03/11/2020 1332   VLDL 23.0 03/11/2020 1332   LDLCALC 58 03/11/2020 1332    Risk Assessment/Calculations:   CHA2DS2-VASc Score = 2   This indicates a 2.2% annual risk of stroke. The patient's score is based upon: CHF History: 0 HTN History: 1 Diabetes History: 1 Stroke History: 0 Vascular Disease History: 0 Age Score: 0 Gender Score: 0     Home Medications   Current Meds  Medication Sig   apixaban (ELIQUIS) 5 MG TABS tablet Take 1 tablet (5 mg total) by mouth 2 (two) times daily.   losartan-hydrochlorothiazide (HYZAAR) 100-12.5 MG tablet TAKE 1 TABLET DAILY   metFORMIN (GLUCOPHAGE) 500 MG tablet Take 1 tablet (500 mg total) by mouth 2 (two) times daily with a meal.   metoprolol succinate (TOPROL-XL) 25 MG 24 hr tablet Take 1 tablet (25 mg total) by mouth daily.     Review of Systems    All other systems reviewed and are otherwise negative except as noted above.  Physical Exam  VS:  BP 112/80   Pulse 80   Ht 5' 9.5" (1.765 m)   Wt 220 lb (99.8 kg)   BMI 32.02 kg/m  , BMI Body mass index is 32.02 kg/m.  Wt Readings from Last 3 Encounters:  03/22/22 220 lb (99.8 kg)  03/19/22 218 lb (98.9 kg)  03/28/20 227 lb (103 kg)     GEN: Well nourished, well developed, in no acute distress. HEENT: normal. Neck: Supple, no JVD, carotid bruits, or masses. Cardiac: IRIR, no murmurs, rubs, or gallops. No clubbing, cyanosis, edema.  Radials/PT 2+ and equal bilaterally.  Respiratory:  Respirations regular and unlabored, clear to auscultation bilaterally. GI: Soft, nontender, nondistended. MS: No deformity or  atrophy. Skin: Warm and dry, no rash. Neuro:  Strength and sensation are intact. Psych: Normal affect.  Assessment & Plan    Atrial fibrillation / Hypercoagulable state - New onset 03/15/22. [place] with Eliquis and metoprolol succinate 25 mg daily initiated 03/19/2022.  CHA2DS2-VASc Score = 2 [CHF History: 0, HTN History: 1, Diabetes History: 1, Stroke History: 0, Vascular Disease History: 0, Age Score: 0, Gender Score: 0].  Therefore, the patient's annual risk of stroke is 2.2 %.    EKG today reveals rate controlled atrial fibrillation.  Symptomatic with sensation of irregular heartbeat, decreased exercise tolerance.  Discussed DCCV vs TEE/DCCV he prefers to proceed with TEE/DCCV for sooner date given symptoms.  CBC/BMP 1 week prior to DCCV.  Update TSH today.  Plan for echocardiogram to rule out valvular abnormality. No snoring suggestive of OSA. No known family history of atrial fibrillation.  Refer to EP for discussion regarding ablation given young age.   Shared Decision Making/Informed Consent{ The risks [stroke, cardiac arrhythmias rarely resulting in the need for a temporary or permanent pacemaker, skin irritation or burns, esophageal damage, perforation (1:10,000 risk), bleeding, pharyngeal hematoma as well as other potential complications associated with conscious sedation including aspiration, arrhythmia, respiratory failure and death], benefits (treatment guidance, restoration of normal sinus rhythm, diagnostic support) and alternatives of a transesophageal echocardiogram guided cardioversion were discussed in detail with Mr. Matusik and he is willing to proceed.   HTN - BP well controlled. Continue current antihypertensive regimen.  Bp at home 120s/80s.   DM2 / BMI 32 -  Continue to follow with PCP. Recently started Metformin. Update lipid panel today.        Disposition: Follow up  2 weeks after cardioversion  with Buford Dresser, MD or APP.  Signed, Loel Dubonnet,  NP 03/22/2022, 8:50 AM [place]

## 2022-03-22 NOTE — Patient Instructions (Signed)
Medication Instructions:  Your Physician recommend you continue on your current medication as directed.    *If you need a refill on your cardiac medications before your next appointment, please call your pharmacy*   Lab Work: Your physician recommends that you return for lab work today- Lipid Panel, Thyroid Panel   If you have labs (blood work) drawn today and your tests are completely normal, you will receive your results only by: MyChart Message (if you have MyChart) OR A paper copy in the mail If you have any lab test that is abnormal or we need to change your treatment, we will call you to review the results.   Testing/Procedures: Your physician has requested that you have an echocardiogram. Echocardiography is a painless test that uses sound waves to create images of your heart. It provides your doctor with information about the size and shape of your heart and how well your heart's chambers and valves are working. This procedure takes approximately one hour. There are no restrictions for this procedure. Please do NOT wear cologne, perfume, aftershave, or lotions (deodorant is allowed). Please arrive 15 minutes prior to your appointment time.   You are scheduled for a TEE (Transesophageal Echocardiogram) Guided Cardioversion on Friday, February 16 with Dr. Servando Salina.  Please arrive at the [facility] (Main Entrance A) at [facility]: [address] [place], [place] [address] at 9:00 AM.   DIET:  Nothing to eat or drink after midnight except a sip of water with medications (see medication instructions below)  MEDICATION INSTRUCTIONS: Continue taking your anticoagulant (blood thinner): Apixaban (Eliquis).  You will need to continue this after your procedure until you are told by your provider that it is safe to stop.    LABS: Done at OV 1/29  FYI:  For your safety, and to allow [place] to monitor your vital signs accurately during the surgery/procedure we request: If you have  artificial nails, gel coating, SNS etc, please have those removed prior to your surgery/procedure. Not having the nail coverings /polish removed may result in cancellation or delay of your surgery/procedure.  You must have a responsible person to drive you home and stay in the waiting area during your procedure. Failure to do so could result in cancellation.  Bring your insurance cards.  *Special Note: Every effort is made to have your procedure done on time. Occasionally there are emergencies that occur at the hospital that may cause delays. Please be patient if a delay does occur.   Follow-Up: At [facility], you and your health needs are our priority.  As part of our continuing mission to provide you with exceptional heart care, we have created designated Provider Care Teams.  These Care Teams include your primary Cardiologist (physician) and Advanced Practice Providers (APPs -  Physician Assistants and Nurse Practitioners) who all work together to provide you with the care you need, when you need it.  We recommend signing up for the patient portal called "MyChart".  Sign up information is provided on this After Visit Summary.  MyChart is used to connect with patients for Virtual Visits (Telemedicine).  Patients are able to view lab/test results, encounter notes, upcoming appointments, etc.  Non-urgent messages can be sent to your provider as well.   To learn more about what you can do with MyChart, go to [email or web address].    Your next appointment:   2 week(s) post Cardioversion   Provider:   Jodelle Red, MD or Gillian Shields, NP  Other Instructions Atrial Fibrillation  Atrial fibrillation is a type of heartbeat that is irregular or fast. If you have this condition, your heart beats without any order. This makes it hard for your heart to pump blood in a normal way. Atrial fibrillation may come and go, or it may become a long-lasting problem. If this condition  is not treated, it can put you at higher risk for stroke, heart failure, and other heart problems. What are the causes? This condition may be caused by diseases that damage the heart. They include: High blood pressure. Heart failure. Heart valve disease. Heart surgery. Other causes include: Diabetes. Thyroid disease. Being overweight. Kidney disease. Sometimes the cause is not known. What increases the risk? You are more likely to develop this condition if: You are older. You smoke. You exercise often and very hard. You have a family history of this condition. You are a man. You use drugs. You drink a lot of alcohol. You have lung conditions, such as emphysema, pneumonia, or COPD. You have sleep apnea. What are the signs or symptoms? Common symptoms of this condition include: A feeling that your heart is beating very fast. Chest pain or discomfort. Feeling short of breath. Suddenly feeling light-headed or weak. Getting tired easily during activity. Fainting. Sweating. In some cases, there are no symptoms. How is this treated? Treatment for this condition depends on underlying conditions and how you feel when you have atrial fibrillation. They include: Medicines to: Prevent blood clots. Treat heart rate or heart rhythm problems. Using devices, such as a pacemaker, to correct heart rhythm problems. Doing surgery to remove the part of the heart that sends bad signals. Closing an area where clots can form in the heart (left atrial appendage). In some cases, your doctor will treat other underlying conditions. Follow these instructions at home: Medicines Take over-the-counter and prescription medicines only as told by your doctor. Do not take any new medicines without first talking to your doctor. If you are taking blood thinners: Talk with your doctor before you take any medicines that have aspirin or NSAIDs, such as ibuprofen, in them. Take your medicine exactly as told  by your doctor. Take it at the same time each day. Avoid activities that could hurt or bruise you. Follow instructions about how to prevent falls. Wear a bracelet that says you are taking blood thinners. Or, carry a card that lists what medicines you take. Lifestyle     Do not use any products that have nicotine or tobacco in them. These include cigarettes, e-cigarettes, and chewing tobacco. If you need help quitting, ask your doctor. Eat heart-healthy foods. Talk with your doctor about the right eating plan for you. Exercise regularly as told by your doctor. Do not drink alcohol. Lose weight if you are overweight. Do not use drugs, including cannabis. General instructions If you have a condition that causes breathing to stop for a short period of time (apnea), treat it as told by your doctor. Keep a healthy weight. Do not use diet pills unless your doctor says they are safe for you. Diet pills may make heart problems worse. Keep all follow-up visits as told by your doctor. This is important. Contact a doctor if: You notice a change in the speed, rhythm, or strength of your heartbeat. You are taking a blood-thinning medicine and you get more bruising. You get tired more easily when you move or exercise. You have a sudden change in weight. Get help right away if:  You  have pain in your chest or your belly (abdomen). You have trouble breathing. You have side effects of blood thinners, such as blood in your vomit, poop (stool), or pee (urine), or bleeding that cannot stop. You have any signs of a stroke. "BE FAST" is an easy way to remember the main warning signs: B - Balance. Signs are dizziness, sudden trouble walking, or loss of balance. E - Eyes. Signs are trouble seeing or a change in how you see. F - Face. Signs are sudden weakness or loss of feeling in the face, or the face or eyelid drooping on one side. A - Arms. Signs are weakness or loss of feeling in an arm. This happens  suddenly and usually on one side of the body. S - Speech. Signs are sudden trouble speaking, slurred speech, or trouble understanding what people say. T - Time. Time to call emergency services. Write down what time symptoms started. You have other signs of a stroke, such as: A sudden, very bad headache with no known cause. Feeling like you may vomit (nausea). Vomiting. A seizure. These symptoms may be an emergency. Do not wait to see if the symptoms will go away. Get medical help right away. Call your local emergency services (911 in the [place] Do not drive yourself to the hospital. Summary Atrial fibrillation is a type of heartbeat that is irregular or fast. You are at higher risk of this condition if you smoke, are older, have diabetes, or are overweight. Follow your doctor's instructions about medicines, diet, exercise, and follow-up visits. Get help right away if you have signs or symptoms of a stroke. Get help right away if you cannot catch your breath, or you have chest pain or discomfort. This information is not intended to replace advice given to you by your health care provider. Make sure you discuss any questions you have with your health care provider. Document Revised: 08/02/2018 Document Reviewed: 08/02/2018 Elsevier Patient Education  [place].

## 2022-03-22 NOTE — Progress Notes (Signed)
Office Visit    Patient Name: Gary Booth Date of Encounter: 03/22/2022  PCP:  Saguier, Edward, [employer]  Cardiologist:  Buford Dresser, MD  Advanced Practice Provider:  No care team member to display Electrophysiologist:  None      Chief Complaint    Gary Booth is a 52 y.o. male presents today for atrial fibrillation   Past Medical History    Past Medical History:  Diagnosis Date   Allergy    Chicken pox    Hyperlipidemia    Seasonal allergies    Past Surgical History:  Procedure Laterality Date   BLEPHAROPLASTY  2010   left eye-lazy eye lid   FINGER SURGERY     Dislocation 4th - Left   TOE SURGERY     Right 2nd - bone spur   WISDOM TOOTH EXTRACTION      Allergies  Allergies  Allergen Reactions   June Grass Pollen Standardized [Timothy Grass Pollen Allergen]     History of Present Illness    Gary Booth is a 52 y.o. male with a hx of hypertension, diabetes type 2, atrial fibrillation last seen 03/28/2020 by Dr. Harrell Gave.  He was seen 03/28/2020 with intermittent palpitations.  They have not occurred recently and as such monitoring of symptoms was recommended.  He contacted the office 03/17/2022 noting recurrent palpitations.  He saw primary care 03/19/2022 and was found of new onset atrial fibrillation.  Metoprolol succinate 25 mg daily and Eliquis 5 mg twice daily were initiated.  He felt onset was 03/15/2022.  Labs 03/19/2022 revealed A1c of 8.6 and metformin was initiated.  Presents today for follow up with his wife. He notes Monday 03/15/22 felt a bit lightheaded while noticing his heart rate was irregular. He felt more fatigued. It gradually has improved but still aware of irregular  beat and decreased activity tolerance. No chest pain, pressure, tightness. No recurrent LE edema since addition of HCTZ to medication regimen.   EKGs/Labs/Other Studies Reviewed:   The following studies were  reviewed today:   EKG:  EKG is ordered today.  The ekg ordered today demonstrates rate controlled atrial fibrillation 80 bpm  Recent Labs: 03/19/2022: ALT 51; BUN 15; Creat 0.76; Potassium 3.9; Sodium 137  Recent Lipid Panel    Component Value Date/Time   CHOL 119 03/11/2020 1332   TRIG 115.0 03/11/2020 1332   HDL 37.90 (L) 03/11/2020 1332   CHOLHDL 3 03/11/2020 1332   VLDL 23.0 03/11/2020 1332   LDLCALC 58 03/11/2020 1332    Risk Assessment/Calculations:   CHA2DS2-VASc Score = 2   This indicates a 2.2% annual risk of stroke. The patient's score is based upon: CHF History: 0 HTN History: 1 Diabetes History: 1 Stroke History: 0 Vascular Disease History: 0 Age Score: 0 Gender Score: 0     Home Medications   Current Meds  Medication Sig   apixaban (ELIQUIS) 5 MG TABS tablet Take 1 tablet (5 mg total) by mouth 2 (two) times daily.   losartan-hydrochlorothiazide (HYZAAR) 100-12.5 MG tablet TAKE 1 TABLET DAILY   metFORMIN (GLUCOPHAGE) 500 MG tablet Take 1 tablet (500 mg total) by mouth 2 (two) times daily with a meal.   metoprolol succinate (TOPROL-XL) 25 MG 24 hr tablet Take 1 tablet (25 mg total) by mouth daily.     Review of Systems    All other systems reviewed and are otherwise negative except as noted above.  Physical Exam  VS:  BP 112/80   Pulse 80   Ht 5' 9.5" (1.765 m)   Wt 220 lb (99.8 kg)   BMI 32.02 kg/m  , BMI Body mass index is 32.02 kg/m.  Wt Readings from Last 3 Encounters:  03/22/22 220 lb (99.8 kg)  03/19/22 218 lb (98.9 kg)  03/28/20 227 lb (103 kg)     GEN: Well nourished, well developed, in no acute distress. HEENT: normal. Neck: Supple, no JVD, carotid bruits, or masses. Cardiac: IRIR, no murmurs, rubs, or gallops. No clubbing, cyanosis, edema.  Radials/PT 2+ and equal bilaterally.  Respiratory:  Respirations regular and unlabored, clear to auscultation bilaterally. GI: Soft, nontender, nondistended. MS: No deformity or  atrophy. Skin: Warm and dry, no rash. Neuro:  Strength and sensation are intact. Psych: Normal affect.  Assessment & Plan    Atrial fibrillation / Hypercoagulable state - New onset 03/15/22. [place] with Eliquis and metoprolol succinate 25 mg daily initiated 03/19/2022.  CHA2DS2-VASc Score = 2 [CHF History: 0, HTN History: 1, Diabetes History: 1, Stroke History: 0, Vascular Disease History: 0, Age Score: 0, Gender Score: 0].  Therefore, the patient's annual risk of stroke is 2.2 %.    EKG today reveals rate controlled atrial fibrillation.  Symptomatic with sensation of irregular heartbeat, decreased exercise tolerance.  Discussed DCCV vs TEE/DCCV he prefers to proceed with TEE/DCCV for sooner date given symptoms.  CBC/BMP 1 week prior to DCCV.  Update TSH today.  Plan for echocardiogram to rule out valvular abnormality. No snoring suggestive of OSA. No known family history of atrial fibrillation.  Refer to EP for discussion regarding ablation given young age.   Shared Decision Making/Informed Consent{ The risks [stroke, cardiac arrhythmias rarely resulting in the need for a temporary or permanent pacemaker, skin irritation or burns, esophageal damage, perforation (1:10,000 risk), bleeding, pharyngeal hematoma as well as other potential complications associated with conscious sedation including aspiration, arrhythmia, respiratory failure and death], benefits (treatment guidance, restoration of normal sinus rhythm, diagnostic support) and alternatives of a transesophageal echocardiogram guided cardioversion were discussed in detail with Gary Booth and he is willing to proceed.   HTN - BP well controlled. Continue current antihypertensive regimen.  Bp at home 120s/80s.   DM2 / BMI 32 -  Continue to follow with PCP. Recently started Metformin. Update lipid panel today.        Disposition: Follow up  2 weeks after cardioversion  with Buford Dresser, MD or APP.  Signed, Loel Dubonnet,  NP 03/22/2022, 8:50 AM [place]

## 2022-03-23 LAB — LIPID PANEL
Chol/HDL Ratio: 3.8 ratio (ref 0.0–5.0)
Cholesterol, Total: 134 mg/dL (ref 100–199)
HDL: 35 mg/dL — ABNORMAL LOW (ref >39)
LDL Chol Calc (NIH): 77 mg/dL (ref 0–99)
Triglycerides: 123 mg/dL (ref 0–149)
VLDL Cholesterol Cal: 22 mg/dL (ref 5–40)

## 2022-03-23 LAB — THYROID PANEL WITH TSH
Free Thyroxine Index: 2.4 (ref 1.2–4.9)
T3 Uptake Ratio: 30 % (ref 24–39)
T4, Total: 8 ug/dL (ref 4.5–12.0)
TSH: 3.39 u[IU]/mL (ref 0.450–4.500)

## 2022-04-06 ENCOUNTER — Ambulatory Visit (INDEPENDENT_AMBULATORY_CARE_PROVIDER_SITE_OTHER): Payer: Commercial Managed Care - PPO

## 2022-04-06 DIAGNOSIS — I4891 Unspecified atrial fibrillation: Secondary | ICD-10-CM

## 2022-04-06 DIAGNOSIS — I10 Essential (primary) hypertension: Secondary | ICD-10-CM

## 2022-04-06 MED ORDER — PERFLUTREN LIPID MICROSPHERE
1.0000 mL | INTRAVENOUS | Status: AC | PRN
  Administered 2022-04-06: 1 mL via INTRAVENOUS

## 2022-04-07 LAB — ECHOCARDIOGRAM COMPLETE
Area-P 1/2: 3.99 cm2
S' Lateral: 2.58 cm

## 2022-04-09 ENCOUNTER — Encounter (HOSPITAL_COMMUNITY): Payer: Self-pay | Admitting: Cardiology

## 2022-04-09 ENCOUNTER — Ambulatory Visit (HOSPITAL_BASED_OUTPATIENT_CLINIC_OR_DEPARTMENT_OTHER): Payer: Commercial Managed Care - PPO | Admitting: Certified Registered"

## 2022-04-09 ENCOUNTER — Ambulatory Visit (HOSPITAL_COMMUNITY)
Admission: RE | Admit: 2022-04-09 | Discharge: 2022-04-09 | Disposition: A | Discharge status: Home / Self Care | Payer: Commercial Managed Care - PPO | Attending: Cardiology | Admitting: Cardiology

## 2022-04-09 ENCOUNTER — Encounter (HOSPITAL_COMMUNITY)
Admission: RE | Disposition: A | Discharge status: Home / Self Care | Payer: Self-pay | Source: Home / Self Care | Attending: Cardiology

## 2022-04-09 ENCOUNTER — Other Ambulatory Visit: Payer: Self-pay

## 2022-04-09 ENCOUNTER — Ambulatory Visit (HOSPITAL_COMMUNITY): Payer: Commercial Managed Care - PPO | Admitting: Certified Registered"

## 2022-04-09 ENCOUNTER — Ambulatory Visit (HOSPITAL_BASED_OUTPATIENT_CLINIC_OR_DEPARTMENT_OTHER): Payer: Commercial Managed Care - PPO

## 2022-04-09 DIAGNOSIS — Z01818 Encounter for other preprocedural examination: Secondary | ICD-10-CM

## 2022-04-09 DIAGNOSIS — Z7901 Long term (current) use of anticoagulants: Secondary | ICD-10-CM | POA: Diagnosis not present

## 2022-04-09 DIAGNOSIS — G709 Myoneural disorder, unspecified: Secondary | ICD-10-CM

## 2022-04-09 DIAGNOSIS — I10 Essential (primary) hypertension: Secondary | ICD-10-CM | POA: Diagnosis not present

## 2022-04-09 DIAGNOSIS — I4891 Unspecified atrial fibrillation: Secondary | ICD-10-CM

## 2022-04-09 DIAGNOSIS — I340 Nonrheumatic mitral (valve) insufficiency: Secondary | ICD-10-CM

## 2022-04-09 DIAGNOSIS — Z87891 Personal history of nicotine dependence: Secondary | ICD-10-CM

## 2022-04-09 DIAGNOSIS — Z7984 Long term (current) use of oral hypoglycemic drugs: Secondary | ICD-10-CM | POA: Insufficient documentation

## 2022-04-09 DIAGNOSIS — E119 Type 2 diabetes mellitus without complications: Secondary | ICD-10-CM | POA: Diagnosis not present

## 2022-04-09 HISTORY — PX: CARDIOVERSION: SHX1299

## 2022-04-09 HISTORY — PX: TEE WITHOUT CARDIOVERSION: SHX5443

## 2022-04-09 LAB — BASIC METABOLIC PANEL
BUN/Creatinine Ratio: 22 — ABNORMAL HIGH (ref 9–20)
BUN: 19 mg/dL (ref 6–24)
CO2: 25 mmol/L (ref 20–29)
Calcium: 9.1 mg/dL (ref 8.7–10.2)
Chloride: 101 mmol/L (ref 96–106)
Creatinine, Ser: 0.85 mg/dL (ref 0.76–1.27)
Glucose: 125 mg/dL — ABNORMAL HIGH (ref 70–99)
Potassium: 3.9 mmol/L (ref 3.5–5.2)
Sodium: 140 mmol/L (ref 134–144)
eGFR: 105 mL/min/{1.73_m2} (ref >59)

## 2022-04-09 LAB — CBC
Hematocrit: 47.9 % (ref 37.5–51.0)
Hemoglobin: 15.7 g/dL (ref 13.0–17.7)
MCH: 30.3 pg (ref 26.6–33.0)
MCHC: 32.8 g/dL (ref 31.5–35.7)
MCV: 93 fL (ref 79–97)
Platelets: 260 10*3/uL (ref 150–450)
RBC: 5.18 x10E6/uL (ref 4.14–5.80)
RDW: 12.8 % (ref 11.6–15.4)
WBC: 7.2 10*3/uL (ref 3.4–10.8)

## 2022-04-09 LAB — ECHO TEE

## 2022-04-09 SURGERY — ECHOCARDIOGRAM, TRANSESOPHAGEAL
Anesthesia: General

## 2022-04-09 MED ORDER — PROPOFOL 10 MG/ML IV BOLUS
INTRAVENOUS | Status: DC | PRN
  Administered 2022-04-09: 30 mg via INTRAVENOUS

## 2022-04-09 MED ORDER — LIDOCAINE 2% (20 MG/ML) 5 ML SYRINGE
INTRAMUSCULAR | Status: DC | PRN
  Administered 2022-04-09: 40 mg via INTRAVENOUS

## 2022-04-09 MED ORDER — SODIUM CHLORIDE 0.9 % IV SOLN: INTRAVENOUS | Status: DC

## 2022-04-09 MED ORDER — PROPOFOL 500 MG/50ML IV EMUL
INTRAVENOUS | Status: DC | PRN
  Administered 2022-04-09: 150 ug/kg/min via INTRAVENOUS

## 2022-04-09 MED ORDER — BUTAMBEN-TETRACAINE-BENZOCAINE 2-2-14 % EX AERO
INHALATION_SPRAY | CUTANEOUS | Status: DC | PRN
  Administered 2022-04-09: 2 via TOPICAL

## 2022-04-09 NOTE — Transfer of Care (Signed)
Immediate Anesthesia Transfer of Care Note  Patient: Gary Booth  Procedure(s) Performed: TRANSESOPHAGEAL ECHOCARDIOGRAM (TEE) CARDIOVERSION  Patient Location: Endoscopy Unit  Anesthesia Type:MAC  Level of Consciousness: awake, alert , and sedated  Airway & Oxygen Therapy: Patient Spontanous Breathing  Post-op Assessment: Post -op Vital signs reviewed and stable  Post vital signs: stable  Last Vitals:  Vitals Value Taken Time  BP 107/77 04/09/22 1024  Temp    Pulse 69 04/09/22 1025  Resp 22 04/09/22 1025  SpO2 97 % 04/09/22 1025  Vitals shown include unvalidated device data.  Last Pain:  Vitals:   04/09/22 1024  TempSrc:   PainSc: 0-No pain         Complications: No notable events documented.

## 2022-04-09 NOTE — Anesthesia Procedure Notes (Signed)
Procedure Name: MAC Date/Time: 04/09/2022 10:09 AM  Performed by: Lavell Luster, CRNAPre-anesthesia Checklist: Patient identified, Emergency Drugs available, Suction available, Patient being monitored and Timeout performed Patient Re-evaluated:Patient Re-evaluated prior to induction Oxygen Delivery Method: Nasal cannula Preoxygenation: Pre-oxygenation with 100% oxygen Induction Type: IV induction Placement Confirmation: breath sounds checked- equal and bilateral and positive ETCO2 Dental Injury: Teeth and Oropharynx as per pre-operative assessment

## 2022-04-09 NOTE — Interval H&P Note (Signed)
History and Physical Interval Note:  04/09/2022 9:40 AM  Gary Booth  has presented today for surgery, with the diagnosis of atrial fibrillation.  The various methods of treatment have been discussed with the patient and family. After consideration of risks, benefits and other options for treatment, the patient has consented to  Procedure(s): TRANSESOPHAGEAL ECHOCARDIOGRAM (TEE) (N/A) CARDIOVERSION (N/A) as a surgical intervention.  The patient's history has been reviewed, patient examined, no change in status, stable for surgery.  I have reviewed the patient's chart and labs.  Questions were answered to the patient's satisfaction.     Natlie Asfour

## 2022-04-09 NOTE — CV Procedure (Signed)
   TRANSESOPHAGEAL ECHOCARDIOGRAM GUIDED DIRECT CURRENT CARDIOVERSION  NAME:  EUCLIDE MENNA    MRN: [id number] DOB:  11/27/1970    ADMIT DATE: 04/09/2022  INDICATIONS: Symptomatic atrial fibrillation  PROCEDURE:   Informed consent was obtained prior to the procedure. The risks, benefits and alternatives for the procedure were discussed and the patient comprehended these risks.  Risks include, but are not limited to, cough, sore throat, vomiting, nausea, somnolence, esophageal and stomach trauma or perforation, bleeding, low blood pressure, aspiration, pneumonia, infection, trauma to the teeth and death.    After a procedural time-out, the oropharynx was anesthetized and the patient was sedated by the anesthesia service. The transesophageal probe was inserted in the esophagus and stomach without difficulty and multiple views were obtained. Anesthesia was monitored by the anesthesiology team.  COMPLICATIONS:    Complications: No complications Patient tolerated procedure well.  KEY FINDINGS:  Normal EF, no LAA clot noted, mild mitral regurgitation. Full Report to follow.   CARDIOVERSION:     Indications:  Symptomatic Atrial Fibrillation  Procedure Details:  Once the TEE was complete, the patient had the defibrillator pads placed in the anterior and posterior position. Once an appropriate level of sedation was confirmed, the patient was cardioverted x 1  with 200J of biphasic synchronized energy.  The patient converted to NSR.  There were no apparent complications.  The patient had normal neuro status and respiratory status post procedure with vitals stable as recorded elsewhere.  Adequate airway was maintained throughout and vital signs monitored per protocol.  The was advised to continue anticoagulation for minimum 4 week with interruption.   All questions has been answered at this time.  Tamika Shropshire, DO [place]  10:20 AM

## 2022-04-09 NOTE — Discharge Instructions (Signed)
Electrical Cardioversion  Electrical cardioversion is the delivery of a jolt of electricity to restore a normal rhythm to the heart. A rhythm that is too fast or is not regular keeps the heart from pumping well. In this procedure, sticky patches or metal paddles are placed on the chest to deliver electricity to the heart from a device.  If this information does not answer your questions, please call [place] office at [phone] to clarify.   Follow these instructions at home: You may have some redness on the skin where the shocks were given.  You may apply over-the-counter hydrocortisone cream or aloe vera to alleviate skin irritation. YOU SHOULD NOT DRIVE, use power tools, machinery or perform tasks that involve climbing or major physical exertion for 24 hours (because of the sedation medicines used during the test).  Take over-the-counter and prescription medicines only as told by your health care provider. Ask your health care provider how to check your pulse. Check it often. Rest for 48 hours after the procedure or as told by your health care provider. Avoid or limit your caffeine use as told by your health care provider. Keep all follow-up visits as told by your health care provider. This is important.  FOLLOW UP:  Please also call with any specific questions about appointments or follow up tests. TEE  YOU HAD AN CARDIAC PROCEDURE TODAY: Refer to the procedure report and other information in the discharge instructions given to you for any specific questions about what was found during the examination. If this information does not answer your questions, please call [place] office at [phone] to clarify.   DIET: Your first meal following the procedure should be a light meal and then it is ok to progress to your normal diet. A half-sandwich or bowl of soup is an example of a good first meal. Heavy or fried foods are harder to digest and may make you feel  nauseous or bloated. Drink plenty of fluids but you should avoid alcoholic beverages for 24 hours. If you had a esophageal dilation, please see attached instructions for diet.   ACTIVITY: Your care partner should take you home directly after the procedure. You should plan to take it easy, moving slowly for the rest of the day. You can resume normal activity the day after the procedure however YOU SHOULD NOT DRIVE, use power tools, machinery or perform tasks that involve climbing or major physical exertion for 24 hours (because of the sedation medicines used during the test).   SYMPTOMS TO REPORT IMMEDIATELY: A cardiologist can be reached at any hour. Please call [phone] for any of the following symptoms:  Vomiting of blood or coffee ground material  New, significant abdominal pain  New, significant chest pain or pain under the shoulder blades  Painful or persistently difficult swallowing  New shortness of breath  Black, tarry-looking or red, bloody stools  FOLLOW UP:  Please also call with any specific questions about appointments or follow up tests.

## 2022-04-10 NOTE — Anesthesia Preprocedure Evaluation (Signed)
Anesthesia Evaluation  Patient identified by MRN, date of birth, ID band Patient awake    Reviewed: Allergy & Precautions, NPO status , Patient's Chart, lab work & pertinent test results  History of Anesthesia Complications Negative for: history of anesthetic complications  Airway Mallampati: II  TM Distance: >3 FB Neck ROM: Full    Dental  (+) Teeth Intact, Dental Advisory Given   Pulmonary neg pulmonary ROS, former smoker   breath sounds clear to auscultation       Cardiovascular + dysrhythmias Atrial Fibrillation  Rhythm:Irregular     Neuro/Psych  Neuromuscular disease  negative psych ROS   GI/Hepatic negative GI ROS, Neg liver ROS,,,  Endo/Other  negative endocrine ROS    Renal/GU negative Renal ROS     Musculoskeletal negative musculoskeletal ROS (+)    Abdominal   Peds  Hematology   Anesthesia Other Findings   Reproductive/Obstetrics                             Anesthesia Physical Anesthesia Plan  ASA: 2  Anesthesia Plan: MAC and General   Post-op Pain Management: Minimal or no pain anticipated   Induction: Intravenous  PONV Risk Score and Plan: 2 and Propofol infusion and Treatment may vary due to age or medical condition  Airway Management Planned: Nasal Cannula and Natural Airway  Additional Equipment: None  Intra-op Plan:   Post-operative Plan:   Informed Consent: I have reviewed the patients History and Physical, chart, labs and discussed the procedure including the risks, benefits and alternatives for the proposed anesthesia with the patient or authorized representative who has indicated his/her understanding and acceptance.     Dental advisory given  Plan Discussed with: CRNA  Anesthesia Plan Comments:        Anesthesia Quick Evaluation

## 2022-04-10 NOTE — Anesthesia Postprocedure Evaluation (Signed)
Anesthesia Post Note  Patient: Gary Booth  Procedure(s) Performed: TRANSESOPHAGEAL ECHOCARDIOGRAM (TEE) CARDIOVERSION     Patient location during evaluation: Endoscopy Anesthesia Type: General Level of consciousness: awake and alert Pain management: pain level controlled Vital Signs Assessment: post-procedure vital signs reviewed and stable Respiratory status: spontaneous breathing, nonlabored ventilation and respiratory function stable Cardiovascular status: stable Postop Assessment: no apparent nausea or vomiting Anesthetic complications: no   No notable events documented.  Last Vitals:  Vitals:   04/09/22 1049 04/09/22 1050  BP: 104/79 104/79  Pulse: 65 66  Resp: 18 17  Temp: 36.4 C   SpO2: 94% 94%    Last Pain:  Vitals:   04/09/22 1049  TempSrc: Oral  PainSc: 0-No pain                 [name]

## 2022-04-11 ENCOUNTER — Encounter (HOSPITAL_COMMUNITY): Payer: Self-pay | Admitting: Cardiology

## 2022-04-12 ENCOUNTER — Other Ambulatory Visit: Payer: Self-pay | Admitting: Medical

## 2022-04-12 ENCOUNTER — Other Ambulatory Visit: Payer: Self-pay

## 2022-04-12 ENCOUNTER — Other Ambulatory Visit (HOSPITAL_COMMUNITY): Payer: Self-pay

## 2022-04-12 MED ORDER — APIXABAN 5 MG PO TABS
5.0000 mg | ORAL_TABLET | Freq: Two times a day (BID) | ORAL | 0 refills | Status: DC
  Filled 2022-04-12: qty 60, 30d supply, fill #0

## 2022-04-22 NOTE — Progress Notes (Signed)
Cardiology Office Note:    Date:  04/23/2022   ID:  Gary Booth, DOB 14-Nov-1970, MRN [id number]  PCP:  Mackie Pai, PA-C  Cardiologist:  Buford Dresser, MD  Referring MD: Elise Benne   CC:  Follow-up  History of Present Illness:    Gary Booth is a 52 y.o. male with a hx of atrial fibrillation, hypertension who is seen for follow-up today. He was initially seen 03/28/2020 as a new consult at the request of Gary, Iris Booth for the evaluation and management of palpitations.  On 03/17/2022 he called the office noting recurrent palpitations. He saw his PCP 1/26 and was found to be in new onset atrial fibrillation (he felt the actual onset was 1/22). Metoprolol succinate 25 mg daily and Eliquis 5 mg BID were started. Lab work 03/19/2022 showed an A1c of 8.6 so he was prescribed metformin.  He followed up with Laurann Montana, NP 03/22/2022. He was still aware of irregular heart beats and decreased activity tolerance. His EKG showed rate controlled atrial fibrillation, 80 bpm. After discussion he preferred to proceed with TEE/DCCV. He was also referred to EP for discussion regarding ablation given his young age. 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 He was cardioverted x 1 with 200J of biphasic synchronized energy.  The patient converted to NSR.  There were no apparent complications.   Today, he is feeling pretty good. Generally he is feeling more energetic. At one time, he felt like his heart may have fluttered once for a couple seconds. This was minor and not bothersome. Otherwise he denies being aware of recurring arrhythmias since his cardioversion.   Lately his blood sugars have been averaging 95-120.  He denies any chest pain, shortness of breath, or peripheral edema. No lightheadedness, headaches, syncope, orthopnea, or PND.  Past Medical History:  Diagnosis Date   Allergy     Chicken pox    Hyperlipidemia    Seasonal allergies     Past Surgical History:  Procedure Laterality Date   BLEPHAROPLASTY  2010   left eye-lazy eye lid   CARDIOVERSION N/A 04/09/2022   Procedure: CARDIOVERSION;  Surgeon: Berniece Salines, DO;  Location: [facility];  Service: Cardiovascular;  Laterality: N/A;   FINGER SURGERY     Dislocation 4th - Left   TEE WITHOUT CARDIOVERSION N/A 04/09/2022   Procedure: TRANSESOPHAGEAL ECHOCARDIOGRAM (TEE);  Surgeon: Berniece Salines, DO;  Location: [facility];  Service: Cardiovascular;  Laterality: N/A;   TOE SURGERY     Right 2nd - bone spur   WISDOM TOOTH EXTRACTION      Current Medications: Current Outpatient Medications on File Prior to Visit  Medication Sig   apixaban (ELIQUIS) 5 MG TABS tablet Take 1 tablet (5 mg total) by mouth 2 (two) times daily.   cholecalciferol (VITAMIN D3) 25 MCG (1000 UNIT) tablet Take 1,000 Units by mouth daily.   losartan-hydrochlorothiazide (HYZAAR) 100-12.5 MG tablet TAKE 1 TABLET DAILY   metFORMIN (GLUCOPHAGE) 500 MG tablet Take 1 tablet (500 mg total) by mouth 2 (two) times daily with a meal.   metoprolol succinate (TOPROL-XL) 25 MG 24 hr tablet Take 1 tablet (25 mg total) by mouth daily.   Omega-3 Fatty Acids (FISH OIL) 1200 MG CAPS Take 2,400 mg by mouth daily.   SUPER B COMPLEX/C PO Take 1 capsule by mouth daily.   No current facility-administered medications on file prior to visit.     Allergies:  June grass pollen standardized [timothy grass pollen allergen]   Social History   Tobacco Use   Smoking status: Former    Packs/day: 0.25    Years: 1.00    Total pack years: 0.25    Types: Cigarettes    Quit date: 07/11/1988    Years since quitting: 33.8   Smokeless tobacco: Former    Types: Chew   Tobacco comments:    2. 5 years  Substance Use Topics   Alcohol use: Yes    Alcohol/week: 0.0 standard drinks of alcohol    Comment: occasional   Drug use: No    Family History: family history  includes Alzheimer's disease in his paternal grandfather; Arthritis in his mother and paternal grandmother; Crohn's disease in his brother; Deep vein thrombosis in his maternal grandmother; Healthy in his daughter; Irritable bowel syndrome in his brother; Prostate cancer in his father; Stroke in his maternal grandmother. mat Gpa had pacemaker, mat uncle also with pacemaker. No known history on paternal side.  ROS:   Please see the history of present illness. All other systems are reviewed and negative.    EKGs/Labs/Other Studies Reviewed:    The following studies were reviewed today:  Echo TEE  04/09/2022:  1. Left ventricular ejection fraction, by estimation, is 60 to 65%. The  left ventricle has normal function.   2. Right ventricular systolic function is normal. The right ventricular  size is normal.   3. No left atrial/left atrial appendage thrombus was detected. The LAA  emptying velocity was 110 cm/s.   4. The mitral valve is normal in structure. Mild mitral valve  regurgitation. No evidence of mitral stenosis.   5. The aortic valve is tricuspid. Aortic valve regurgitation is not  visualized. No aortic stenosis is present.   6. There is mild dilatation of the aortic root, measuring 37 mm.   Echo  04/06/2022:  1. Left ventricular ejection fraction, by estimation, is 55 to 60%. Left  ventricular ejection fraction by 3D volume is 56 %. The left ventricle has  normal function. The left ventricle has no regional wall motion  abnormalities. There is mild left  ventricular hypertrophy. Left ventricular diastolic parameters were normal  by tissue Doppler.   2. Right ventricular systolic function is normal. The right ventricular  size is normal. There is normal pulmonary artery systolic pressure. The  estimated right ventricular systolic pressure is Q000111Q mmHg.   3. The mitral valve is normal in structure. Trivial mitral valve  regurgitation. No evidence of mitral stenosis.   4. The  aortic valve is tricuspid. There is mild calcification of the  aortic valve. Aortic valve regurgitation is not visualized. No aortic  stenosis is present.   5. The inferior vena cava is normal in size with greater than 50%  respiratory variability, suggesting right atrial pressure of 3 mmHg.    EKG:  EKG is personally reviewed.   04/23/2022:  EKG was not ordered. 03/28/2020:  SR with sinus arrhythmia at 65 bpm  Recent Labs: 03/19/2022: ALT 51 03/22/2022: TSH 3.390 04/08/2022: BUN 19; Creatinine, Ser 0.85; Hemoglobin 15.7; Platelets 260; Potassium 3.9; Sodium 140   Recent Lipid Panel    Component Value Date/Time   CHOL 134 03/22/2022 0921   TRIG 123 03/22/2022 0921   HDL 35 (L) 03/22/2022 0921   CHOLHDL 3.8 03/22/2022 0921   CHOLHDL 3 03/11/2020 1332   VLDL 23.0 03/11/2020 1332   LDLCALC 77 03/22/2022 0921    Physical Exam:  VS:  BP 127/83 (BP Location: Left Arm, Patient Position: Sitting, Cuff Size: Normal)   Pulse 64   Ht 5' 9.5" (1.765 m)   Wt 219 lb 3.2 oz (99.4 kg)   SpO2 92%   BMI 31.91 kg/m     Wt Readings from Last 3 Encounters:  04/23/22 219 lb 3.2 oz (99.4 kg)  04/09/22 215 lb (97.5 kg)  03/22/22 220 lb (99.8 kg)    GEN: Well nourished, well developed in no acute distress HEENT: Normal, moist mucous membranes NECK: No JVD CARDIAC: regular rhythm, normal S1 and S2, no rubs or gallops. No murmur. VASCULAR: Radial and DP pulses 2+ bilaterally. No carotid bruits RESPIRATORY:  Clear to auscultation without rales, wheezing or rhonchi  ABDOMEN: Soft, non-tender, non-distended MUSCULOSKELETAL:  Ambulates independently SKIN: Warm and dry, no edema NEUROLOGIC:  Alert and oriented x 3. No focal neuro deficits noted. PSYCHIATRIC:  Normal affect    ASSESSMENT:    1. Paroxysmal atrial fibrillation (HCC)   2. Secondary hypercoagulable state ([place])   3. Long term current use of anticoagulant   4. Essential hypertension   5. Type 2 diabetes mellitus without  complication, without long-term current use of insulin (HCC)     PLAN:    Atrial fibrillation, paroxysmal -s/p TEE-CV, has remained in SR since. Feels much better in sinus rhythm -CHA2DS2/VAS Stroke Risk Points= 2 (HTN, DM) -discussed options at length today. He has an upcoming visit with Dr. Curt Bears as well. He feels significantly better in sinus rhythm. Reviewed ablation, also discussed pill in the pocket strategy if he does not want to pursue ablation. Given general information today. He will discuss options with Dr. Curt Bears. -he will call me if symptoms recur -he is amenable to continuing anticoagulation   Hypertension: well controlled today -continue losartan-HCTZ  Type II diabetes: -well controlled now on metformin -no aspirin as he is on apixaban -would consider statin  Cardiac risk assessment  -ASCVD risk score: The 10-year ASCVD risk score (Arnett DK, et al., 2019) is: 7.8%   Values used to calculate the score:     Age: 69 years     Sex: Male     Is Non-Hispanic African American: No     Diabetic: Yes     Tobacco smoker: No     Systolic Blood Pressure: [id number]     Is BP treated: Yes     HDL Cholesterol: 35 mg/dL     Total Cholesterol: 134 mg/dL    Plan for follow up: 1 year or sooner as needed.  Buford Dresser, MD, PhD, [facility]    Medication Adjustments/Labs and Tests Ordered: Current medicines are reviewed at length with the patient today.  Concerns regarding medicines are outlined above.   No orders of the defined types were placed in this encounter.  No orders of the defined types were placed in this encounter.  Patient Instructions  Medication Instructions:  Your physician recommends that you continue on your current medications as directed. Please refer to the Current Medication list given to you today.  *If you need a refill on your cardiac medications before your next appointment, please call your  pharmacy*  Follow-Up: At [facility], you and your health needs are our priority.  As part of our continuing mission to provide you with exceptional heart care, we have created designated Provider Care Teams.  These Care Teams include your primary Cardiologist (physician) and Advanced Practice Providers (APPs -  Physician Assistants  and Nurse Practitioners) who all work together to provide you with the care you need, when you need it.  We recommend signing up for the patient portal called "MyChart".  Sign up information is provided on this After Visit Summary.  MyChart is used to connect with patients for Virtual Visits (Telemedicine).  Patients are able to view lab/test results, encounter notes, upcoming appointments, etc.  Non-urgent messages can be sent to your provider as well.   To learn more about what you can do with MyChart, go to [email or web address].    Your next appointment:   Follow up as scheduled with Dr. Curt Bears   &   Follow up in one year with Dr. Harrell Gave!     I,Mathew Stumpf,acting as a Education administrator for [employer], MD.,have documented all relevant documentation on the behalf of Buford Dresser, MD,as directed by  Buford Dresser, MD while in the presence of Buford Dresser, MD.  I, Buford Dresser, MD, have reviewed all documentation for this visit. The documentation on 04/23/22 for the exam, diagnosis, procedures, and orders are all accurate and complete.   Signed, Buford Dresser, MD PhD 04/23/2022 9:32 AM    [place]

## 2022-04-23 ENCOUNTER — Encounter (HOSPITAL_BASED_OUTPATIENT_CLINIC_OR_DEPARTMENT_OTHER): Payer: Self-pay | Admitting: Cardiology

## 2022-04-23 ENCOUNTER — Ambulatory Visit (HOSPITAL_BASED_OUTPATIENT_CLINIC_OR_DEPARTMENT_OTHER): Payer: Commercial Managed Care - PPO | Admitting: Cardiology

## 2022-04-23 VITALS — BP 127/83 | HR 64 | Ht 69.5 in | Wt 219.2 lb

## 2022-04-23 DIAGNOSIS — E119 Type 2 diabetes mellitus without complications: Secondary | ICD-10-CM

## 2022-04-23 DIAGNOSIS — I480 Paroxysmal atrial fibrillation: Secondary | ICD-10-CM

## 2022-04-23 DIAGNOSIS — I10 Essential (primary) hypertension: Secondary | ICD-10-CM

## 2022-04-23 DIAGNOSIS — D6869 Other thrombophilia: Secondary | ICD-10-CM | POA: Diagnosis not present

## 2022-04-23 DIAGNOSIS — Z7901 Long term (current) use of anticoagulants: Secondary | ICD-10-CM | POA: Diagnosis not present

## 2022-04-23 NOTE — Patient Instructions (Signed)
Medication Instructions:  Your physician recommends that you continue on your current medications as directed. Please refer to the Current Medication list given to you today.  *If you need a refill on your cardiac medications before your next appointment, please call your pharmacy*  Follow-Up: At [facility], you and your health needs are our priority.  As part of our continuing mission to provide you with exceptional heart care, we have created designated Provider Care Teams.  These Care Teams include your primary Cardiologist (physician) and Advanced Practice Providers (APPs -  Physician Assistants and Nurse Practitioners) who all work together to provide you with the care you need, when you need it.  We recommend signing up for the patient portal called "MyChart".  Sign up information is provided on this After Visit Summary.  MyChart is used to connect with patients for Virtual Visits (Telemedicine).  Patients are able to view lab/test results, encounter notes, upcoming appointments, etc.  Non-urgent messages can be sent to your provider as well.   To learn more about what you can do with MyChart, go to [email or web address].    Your next appointment:   Follow up as scheduled with Dr. Curt Bears   &   Follow up in one year with Dr. Harrell Gave!

## 2022-05-07 ENCOUNTER — Encounter: Payer: Self-pay | Admitting: Cardiology

## 2022-05-07 ENCOUNTER — Ambulatory Visit: Payer: Commercial Managed Care - PPO | Attending: Cardiology | Admitting: Cardiology

## 2022-05-07 VITALS — BP 120/82 | HR 62 | Ht 69.5 in | Wt 217.0 lb

## 2022-05-07 DIAGNOSIS — I480 Paroxysmal atrial fibrillation: Secondary | ICD-10-CM | POA: Diagnosis not present

## 2022-05-07 DIAGNOSIS — D6869 Other thrombophilia: Secondary | ICD-10-CM

## 2022-05-07 DIAGNOSIS — I10 Essential (primary) hypertension: Secondary | ICD-10-CM | POA: Diagnosis not present

## 2022-05-07 NOTE — Progress Notes (Signed)
Electrophysiology Office Note   Date:  05/07/2022   ID:  Gary Booth, DOB March 12, 1970, MRN [id number]  PCP:  Mackie Pai, PA-C  Cardiologist:  Harrell Gave Primary Electrophysiologist:  Markasia Carrol Meredith Leeds, MD    Chief Complaint: AF   History of Present Illness: Gary Booth is a 52 y.o. male who is being seen today for the evaluation of AF at the request of Loel Dubonnet, NP. Presenting today for electrophysiology evaluation.  He has a history significant for atrial fibrillation and hypertension.  He presented with palpitations in 2022.  He called the office January 2024 with recurrent palpitations and was noted to be in atrial fibrillation.  He is post TEE and cardioversion.  He had a normal echo.  He feels more energy in sinus rhythm with less shortness of breath.  He did previously note palpitations.  He felt weak and fatigued when he was in atrial fibrillation.  Since his cardioversion he is felt well without complaint.  Today, he denies symptoms of palpitations, chest pain, shortness of breath, orthopnea, PND, lower extremity edema, claudication, dizziness, presyncope, syncope, bleeding, or neurologic sequela. The patient is tolerating medications without difficulties.    Past Medical History:  Diagnosis Date   Allergy    Chicken pox    Hyperlipidemia    Seasonal allergies    Past Surgical History:  Procedure Laterality Date   BLEPHAROPLASTY  2010   left eye-lazy eye lid   CARDIOVERSION N/A 04/09/2022   Procedure: CARDIOVERSION;  Surgeon: Berniece Salines, DO;  Location: [facility];  Service: Cardiovascular;  Laterality: N/A;   FINGER SURGERY     Dislocation 4th - Left   TEE WITHOUT CARDIOVERSION N/A 04/09/2022   Procedure: TRANSESOPHAGEAL ECHOCARDIOGRAM (TEE);  Surgeon: Berniece Salines, DO;  Location: [facility];  Service: Cardiovascular;  Laterality: N/A;   TOE SURGERY     Right 2nd - bone spur   WISDOM TOOTH EXTRACTION       Current Outpatient  Medications  Medication Sig Dispense Refill   apixaban (ELIQUIS) 5 MG TABS tablet Take 1 tablet (5 mg total) by mouth 2 (two) times daily. 60 tablet 0   losartan-hydrochlorothiazide (HYZAAR) 100-12.5 MG tablet TAKE 1 TABLET DAILY 90 tablet 1   metFORMIN (GLUCOPHAGE) 500 MG tablet Take 1 tablet (500 mg total) by mouth 2 (two) times daily with a meal. 180 tablet 3   metoprolol succinate (TOPROL-XL) 25 MG 24 hr tablet Take 1 tablet (25 mg total) by mouth daily. 30 tablet 3   Omega-3 Fatty Acids (FISH OIL) 1200 MG CAPS Take 2,400 mg by mouth daily.     cholecalciferol (VITAMIN D3) 25 MCG (1000 UNIT) tablet Take 1,000 Units by mouth daily. (Patient not taking: Reported on 05/07/2022)     SUPER B COMPLEX/C PO Take 1 capsule by mouth daily. (Patient not taking: Reported on 05/07/2022)     No current facility-administered medications for this visit.    Allergies:   June grass pollen standardized [timothy grass pollen allergen]   Social History:  The patient  reports that he quit smoking about 33 years ago. His smoking use included cigarettes. He has a 0.25 pack-year smoking history. He has quit using smokeless tobacco.  His smokeless tobacco use included chew. He reports current alcohol use. He reports that he does not use drugs.   Family History:  The patient's family history includes Alzheimer's disease in his paternal grandfather; Arthritis in his mother and paternal grandmother; Crohn's disease in his brother; Deep vein  thrombosis in his maternal grandmother; Healthy in his daughter; Irritable bowel syndrome in his brother; Prostate cancer in his father; Stroke in his maternal grandmother.    ROS:  Please see the history of present illness.   Otherwise, review of systems is positive for none.   All other systems are reviewed and negative.    PHYSICAL EXAM: VS:  BP 120/82   Pulse 62   Ht 5' 9.5" (1.765 m)   Wt 217 lb (98.4 kg)   SpO2 97%   BMI 31.59 kg/m  , BMI Body mass index is 31.59  kg/m. GEN: Well nourished, well developed, in no acute distress  HEENT: normal  Neck: no JVD, carotid bruits, or masses Cardiac: RRR; no murmurs, rubs, or gallops,no edema  Respiratory:  clear to auscultation bilaterally, normal work of breathing GI: soft, nontender, nondistended, + BS MS: no deformity or atrophy  Skin: warm and dry Neuro:  Strength and sensation are intact Psych: euthymic mood, full affect  EKG:  EKG is ordered today. Personal review of the ekg ordered shows sinus rhythm, rate 62  Recent Labs: 03/19/2022: ALT 51 03/22/2022: TSH 3.390 04/08/2022: BUN 19; Creatinine, Ser 0.85; Hemoglobin 15.7; Platelets 260; Potassium 3.9; Sodium 140    Lipid Panel     Component Value Date/Time   CHOL 134 03/22/2022 0921   TRIG 123 03/22/2022 0921   HDL 35 (L) 03/22/2022 0921   CHOLHDL 3.8 03/22/2022 0921   CHOLHDL 3 03/11/2020 1332   VLDL 23.0 03/11/2020 1332   LDLCALC 77 03/22/2022 0921     Wt Readings from Last 3 Encounters:  05/07/22 217 lb (98.4 kg)  04/23/22 219 lb 3.2 oz (99.4 kg)  04/09/22 215 lb (97.5 kg)      Other studies Reviewed: Additional studies/ records that were reviewed today include: TTE 04/07/22  Review of the above records today demonstrates:   1. Left ventricular ejection fraction, by estimation, is 55 to 60%. Left  ventricular ejection fraction by 3D volume is 56 %. The left ventricle has  normal function. The left ventricle has no regional wall motion  abnormalities. There is mild left  ventricular hypertrophy. Left ventricular diastolic parameters were normal  by tissue Doppler.   2. Right ventricular systolic function is normal. The right ventricular  size is normal. There is normal pulmonary artery systolic pressure. The  estimated right ventricular systolic pressure is Q000111Q mmHg.   3. The mitral valve is normal in structure. Trivial mitral valve  regurgitation. No evidence of mitral stenosis.   4. The aortic valve is tricuspid. There is  mild calcification of the  aortic valve. Aortic valve regurgitation is not visualized. No aortic  stenosis is present.   5. The inferior vena cava is normal in size with greater than 50%  respiratory variability, suggesting right atrial pressure of 3 mmHg.    ASSESSMENT AND PLAN:  1.  Paroxysmal atrial fibrillation: CHA2DS2-VASc of 2.  Currently on Eliquis.  He feels weak and fatigued due to his atrial fibrillation.  He would potentially like a rhythm control strategy, but he is not quite ready for ablation or medication management.  We Marrie Chandra give him information on both of these.  Jillana Selph see him back in 6 months for further management.  2.  Hypertension: Currently well-controlled    Current medicines are reviewed at length with the patient today.   The patient does not have concerns regarding his medicines.  The following changes were made today:  none  Labs/ tests  ordered today include:  Orders Placed This Encounter  Procedures   EKG 12-Lead     Disposition:   FU with Melvern Ramone 6 months  Signed, Gilberto Stanforth Meredith Leeds, MD  05/07/2022 9:25 AM     [facility] [address] [place] [address] [phone] (office) [phone] (fax)

## 2022-05-07 NOTE — Patient Instructions (Addendum)
Medication Instructions:  Your physician recommends that you continue on your current medications as directed. Please refer to the Current Medication list given to you today.  *If you need a refill on your cardiac medications before your next appointment, please call your pharmacy*   Lab Work: None ordered   Testing/Procedures: None ordered   Follow-Up: At [facility], you and your health needs are our priority.  As part of our continuing mission to provide you with exceptional heart care, we have created designated Provider Care Teams.  These Care Teams include your primary Cardiologist (physician) and Advanced Practice Providers (APPs -  Physician Assistants and Nurse Practitioners) who all work together to provide you with the care you need, when you need it.  Your next appointment:   6 month(s)  The format for your next appointment:   In Person  Provider:   Allegra Lai, MD    Thank you for choosing [facility]!!   Trinidad Curet, RN [phone]  Other Instructions  Flecainide Tablets What is this medication? FLECAINIDE (FLEK a nide) prevents and treats a fast or irregular heartbeat (arrhythmia). It is often used to treat a type of arrhythmia known as AFib (atrial fibrillation). It works by slowing down overactive electric signals in the heart, which stabilizes your heart rhythm. It belongs to a group of medications called antiarrhythmics. This medicine may be used for other purposes; ask your health care provider or pharmacist if you have questions. COMMON BRAND NAME(S): Tambocor What should I tell my care team before I take this medication? They need to know if you have any of these conditions: High or low levels of potassium in the blood Heart disease including heart rhythm and heart rate problems Kidney disease Liver disease Recent heart attack An unusual or allergic reaction to flecainide, other medications, foods, dyes, or preservatives Pregnant or  trying to get pregnant Breastfeeding How should I use this medication? Take this medication by mouth with a glass of water. Take it as directed on the prescription label at the same time every day. You can take it with or without food. If it upsets your stomach, take it with food. Do not take your medication more often than directed. Do not stop taking this medication suddenly. This may cause serious, heart-related side effects. If your care team wants you to stop the medication, the dose may be slowly lowered over time to avoid any side effects. Talk to your care team about the use of this medication in children. While it may be prescribed for children as young as 1 year for selected conditions, precautions do apply. Overdosage: If you think you have taken too much of this medicine contact a poison control center or emergency room at once. NOTE: This medicine is only for you. Do not share this medicine with others. What if I miss a dose? If you miss a dose, take it as soon as you can. If it is almost time for your next dose, take only that dose. Do not take double or extra doses. What may interact with this medication? Do not take this medication with any of the following: Amoxapine Arsenic trioxide Certain antibiotics, such as clarithromycin, erythromycin, gatifloxacin, gemifloxacin, levofloxacin, moxifloxacin, sparfloxacin, or troleandomycin Certain antidepressants, called tricyclic antidepressants such as amitriptyline, imipramine, or nortriptyline Certain medications for irregular heartbeat, such as disopyramide, encainide, moricizine, procainamide, propafenone, and quinidine Cisapride Delavirdine Droperidol Haloperidol [place]orn Imatinib Levomethadyl Maprotiline Medications for malaria, such as chloroquine and halofantrine Pentamidine Phenothiazines, such as chlorpromazine,  mesoridazine, prochlorperazine, thioridazine Pimozide Quinine Ranolazine Ritonavir Sertindole This  medication may also interact with the following: Cimetidine Dofetilide Medications for angina or blood pressure Medications for irregular heartbeat, such as amiodarone and digoxin Ziprasidone This list may not describe all possible interactions. Give your health care provider a list of all the medicines, herbs, non-prescription drugs, or dietary supplements you use. Also tell them if you smoke, drink alcohol, or use illegal drugs. Some items may interact with your medicine. What should I watch for while using this medication? Visit your care team for regular checks on your progress. Because your condition and the use of this medication carries some risk, it is a good idea to carry an identification card, necklace, or bracelet with details of your condition, medications, and care team. Check your blood pressure and pulse rate as directed. Know what your blood pressure and pulse rate should be and when tod contact your care team. Your care team may schedule regular blood tests and electrocardiograms to check your progress. This medication may affect your coordination, reaction time, or judgment. Do not drive or operate machinery until you know how this medication affects you. Sit up or stand slowly to reduce the risk of dizzy or fainting spells. Drinking alcohol with this medication can increase the risk of these side effects. What side effects may I notice from receiving this medication? Side effects that you should report to your care team as soon as possible: Allergic reactions--skin rash, itching, hives, swelling of the face, lips, tongue, or throat Heart failure--shortness of breath, swelling of the ankles, feet, or hands, sudden weight gain, unusual weakness or fatigue Heart rhythm changes--fast or irregular heartbeat, dizziness, feeling faint or lightheaded, chest pain, trouble breathing Liver injury--right upper belly pain, loss of appetite, nausea, light-colored stool, dark yellow or brown  urine, yellowing skin or eyes, unusual weakness or fatigue Side effects that usually do not require medical attention (report to your care team if they continue or are bothersome): Blurry vision Constipation Dizziness Fatigue Headache Nausea Tremors or shaking This list may not describe all possible side effects. Call your doctor for medical advice about side effects. You may report side effects to FDA at 1-800-FDA-1088. Where should I keep my medication? Keep out of the reach of children and pets. Store at room temperature between 15 and 30 degrees C (59 and 86 degrees F). Protect from light. Keep container tightly closed. Throw away any unused medication after the expiration date. NOTE: This sheet is a summary. It may not cover all possible information. If you have questions about this medicine, talk to your doctor, pharmacist, or health care provider.  2023 Elsevier/Gold Standard (2020-04-04 00:00:00)    Cardiac Ablation Cardiac ablation is a procedure to destroy (ablate) heart tissue that is sending bad signals. These bad signals cause the heart to beat very fast or in a way that is not normal. Destroying some tissues can help make the heart rhythm normal. Tell your doctor about: Any allergies you have. All medicines you are taking. These include vitamins, herbs, eye drops, creams, and over-the-counter medicines. Any problems you or family members have had with anesthesia. Any bleeding problems you have. Any surgeries you have had. Any medical conditions you have. Whether you are pregnant or may be pregnant. What are the risks? Your doctor will talk with you about risks. These may include: Infection. Bruising and bleeding. Stroke or blood clots. Damage to nearby areas of your body. Allergies to medicines or dyes. Needing a pacemaker  if the heart gets damaged. A pacemaker helps the heart beat normally. The procedure not working. What happens before the  procedure? Medicines Ask your doctor about changing or stopping: Your normal medicines. Vitamins, herbs, and supplements. Over-the-counter medicines. Do not take aspirin or ibuprofen unless you are told to. General instructions Follow instructions from your doctor about what you may eat and drink. If you will be going home right after the procedure, plan to have a responsible adult: Take you home from the hospital or clinic. You will not be allowed to drive. Care for you for the time you are told. Ask your doctor what steps will be taken to prevent the spread of germs. What happens during the procedure?  An IV tube will be put into one of your veins. You may be given: A sedative. This helps you relax. Anesthesia. This will: Numb certain areas of your body. The skin on your neck or groin will be numbed. A cut (incision) will be made in your neck or groin. A needle will be put through the cut and into a large vein. The small, thin tube (catheter) will be put into the needle. The tube will be moved to your heart. A type of X-ray (fluoroscopy) will be used to help guide the tube. It will also show constant images of the heart on a screen. Dye may be put through the tube. This helps your doctor see your heart. An electric current will be sent from the tube to destroy heart tissue in certain areas. The tube will be taken out. Pressure will be held on your cut. This helps stop bleeding. A bandage (dressing) will be put over your cut. The procedure may vary among doctors and hospitals. What happens after the procedure? You will be monitored until you leave the hospital or clinic. This includes checking your blood pressure, heart rate and rhythm, breathing rate, and blood oxygen level. Your cut will be checked for bleeding. You will need to lie still for a few hours. If your groin was used, you will need to keep your leg straight for a few hours after the small, thin tube is removed. This  information is not intended to replace advice given to you by your health care provider. Make sure you discuss any questions you have with your health care provider. Document Revised: 07/28/2021 Document Reviewed: 07/28/2021 Elsevier Patient Education  [place].

## 2022-05-11 ENCOUNTER — Other Ambulatory Visit: Payer: Self-pay

## 2022-05-11 ENCOUNTER — Other Ambulatory Visit (HOSPITAL_COMMUNITY): Payer: Self-pay

## 2022-05-11 ENCOUNTER — Other Ambulatory Visit: Payer: Self-pay | Admitting: Medical

## 2022-05-11 MED ORDER — APIXABAN 5 MG PO TABS
5.0000 mg | ORAL_TABLET | Freq: Two times a day (BID) | ORAL | 0 refills | Status: DC
  Filled 2022-05-11: qty 60, 30d supply, fill #0
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
## 2022-05-24 ENCOUNTER — Other Ambulatory Visit: Payer: Self-pay | Admitting: Medical

## 2022-06-18 ENCOUNTER — Other Ambulatory Visit: Payer: Self-pay | Admitting: Medical

## 2022-06-18 ENCOUNTER — Other Ambulatory Visit (HOSPITAL_COMMUNITY): Payer: Self-pay

## 2022-06-18 ENCOUNTER — Encounter: Payer: Self-pay | Admitting: *Deleted

## 2022-06-18 MED ORDER — APIXABAN 5 MG PO TABS
5.0000 mg | ORAL_TABLET | Freq: Two times a day (BID) | ORAL | 0 refills | Status: DC
  Filled 2022-06-18: qty 60, 30d supply, fill #0

## 2022-06-19 ENCOUNTER — Other Ambulatory Visit (HOSPITAL_COMMUNITY): Payer: Self-pay

## 2022-07-13 ENCOUNTER — Other Ambulatory Visit: Payer: Self-pay | Admitting: Medical

## 2022-07-13 ENCOUNTER — Other Ambulatory Visit (HOSPITAL_COMMUNITY): Payer: Self-pay

## 2022-07-13 MED ORDER — APIXABAN 5 MG PO TABS
5.0000 mg | ORAL_TABLET | Freq: Two times a day (BID) | ORAL | 0 refills | Status: DC
  Filled 2022-07-13: qty 60, 30d supply, fill #0

## 2022-07-13 MED ORDER — METOPROLOL SUCCINATE ER 25 MG PO TB24
25.0000 mg | ORAL_TABLET | Freq: Every day | ORAL | 0 refills | Status: DC
  Filled 2022-07-13: qty 30, 30d supply, fill #0

## 2022-07-21 ENCOUNTER — Encounter (HOSPITAL_BASED_OUTPATIENT_CLINIC_OR_DEPARTMENT_OTHER): Payer: Self-pay

## 2022-07-22 NOTE — Telephone Encounter (Signed)
FYI update, please advise

## 2022-07-22 NOTE — Telephone Encounter (Signed)
Pt. Responding  

## 2022-07-27 NOTE — Telephone Encounter (Signed)
BP log follow up

## 2022-07-29 ENCOUNTER — Other Ambulatory Visit (HOSPITAL_COMMUNITY): Payer: Self-pay

## 2022-07-29 MED ORDER — LOSARTAN POTASSIUM 100 MG PO TABS
100.0000 mg | ORAL_TABLET | Freq: Every day | ORAL | 3 refills | Status: DC
  Filled 2022-07-29: qty 30, 30d supply, fill #0
  Filled 2022-08-13 – 2022-08-24 (×3): qty 30, 30d supply, fill #1
  Filled 2022-09-26: qty 30, 30d supply, fill #2
  Filled 2022-10-25: qty 30, 30d supply, fill #3
  Filled 2022-11-28: qty 30, 30d supply, fill #4

## 2022-08-13 ENCOUNTER — Other Ambulatory Visit: Payer: Self-pay | Admitting: Medical

## 2022-08-13 ENCOUNTER — Other Ambulatory Visit (HOSPITAL_COMMUNITY): Payer: Self-pay

## 2022-08-13 MED ORDER — APIXABAN 5 MG PO TABS
5.0000 mg | ORAL_TABLET | Freq: Two times a day (BID) | ORAL | 0 refills | Status: DC
  Filled 2022-08-13: qty 60, 30d supply, fill #0

## 2022-08-17 ENCOUNTER — Other Ambulatory Visit (HOSPITAL_COMMUNITY): Payer: Self-pay

## 2022-08-17 ENCOUNTER — Encounter (HOSPITAL_BASED_OUTPATIENT_CLINIC_OR_DEPARTMENT_OTHER): Payer: Self-pay

## 2022-08-17 DIAGNOSIS — I480 Paroxysmal atrial fibrillation: Secondary | ICD-10-CM

## 2022-08-17 MED ORDER — APIXABAN 5 MG PO TABS
5.0000 mg | ORAL_TABLET | Freq: Two times a day (BID) | ORAL | 1 refills | Status: DC
  Filled 2022-08-17: qty 60, 30d supply, fill #0
  Filled 2022-09-26: qty 60, 30d supply, fill #1
  Filled 2022-10-21: qty 60, 30d supply, fill #2
  Filled 2022-11-28: qty 60, 30d supply, fill #3
  Filled 2022-12-29: qty 60, 30d supply, fill #4
  Filled 2023-01-31: qty 60, 30d supply, fill #5

## 2022-08-17 NOTE — Telephone Encounter (Signed)
Eliquis refill please  

## 2022-08-17 NOTE — Telephone Encounter (Signed)
Prescription refill request for Eliquis received. Indication: Afib  Last office visit: 05/07/22 ([place])  Scr:  0.85 (04/08/22)  Age: 52 Weight: 98.4kg  Appropriate dose. Refill sent.

## 2022-08-18 ENCOUNTER — Other Ambulatory Visit (HOSPITAL_COMMUNITY): Payer: Self-pay

## 2022-09-03 ENCOUNTER — Encounter: Payer: Self-pay | Admitting: Medical

## 2022-09-03 DIAGNOSIS — Z1211 Encounter for screening for malignant neoplasm of colon: Secondary | ICD-10-CM

## 2022-09-17 ENCOUNTER — Encounter: Payer: Self-pay | Admitting: Internal Medicine

## 2022-09-17 ENCOUNTER — Telehealth: Payer: Self-pay | Admitting: Internal Medicine

## 2022-09-17 NOTE — Telephone Encounter (Signed)
error 

## 2022-09-27 ENCOUNTER — Other Ambulatory Visit (HOSPITAL_COMMUNITY): Payer: Self-pay

## 2022-10-26 ENCOUNTER — Other Ambulatory Visit (HOSPITAL_COMMUNITY): Payer: Self-pay

## 2022-11-29 ENCOUNTER — Other Ambulatory Visit (HOSPITAL_COMMUNITY): Payer: Self-pay

## 2022-12-24 ENCOUNTER — Ambulatory Visit (INDEPENDENT_AMBULATORY_CARE_PROVIDER_SITE_OTHER): Payer: Commercial Managed Care - PPO | Admitting: Internal Medicine

## 2022-12-24 ENCOUNTER — Telehealth: Payer: Self-pay

## 2022-12-24 ENCOUNTER — Other Ambulatory Visit (INDEPENDENT_AMBULATORY_CARE_PROVIDER_SITE_OTHER): Payer: Commercial Managed Care - PPO

## 2022-12-24 ENCOUNTER — Other Ambulatory Visit (HOSPITAL_COMMUNITY): Payer: Self-pay

## 2022-12-24 ENCOUNTER — Encounter: Payer: Self-pay | Admitting: Internal Medicine

## 2022-12-24 VITALS — BP 130/70 | HR 64 | Ht 69.5 in | Wt 205.0 lb

## 2022-12-24 DIAGNOSIS — R7989 Other specified abnormal findings of blood chemistry: Secondary | ICD-10-CM | POA: Diagnosis not present

## 2022-12-24 DIAGNOSIS — Z1211 Encounter for screening for malignant neoplasm of colon: Secondary | ICD-10-CM | POA: Diagnosis not present

## 2022-12-24 DIAGNOSIS — Z7901 Long term (current) use of anticoagulants: Secondary | ICD-10-CM | POA: Diagnosis not present

## 2022-12-24 LAB — HEPATIC FUNCTION PANEL
ALT: 37 U/L (ref 0–53)
AST: 38 U/L — ABNORMAL HIGH (ref 0–37)
Albumin: 4.5 g/dL (ref 3.5–5.2)
Alkaline Phosphatase: 93 U/L (ref 39–117)
Bilirubin, Direct: 0.1 mg/dL (ref 0.0–0.3)
Total Bilirubin: 0.5 mg/dL (ref 0.2–1.2)
Total Protein: 7.2 g/dL (ref 6.0–8.3)

## 2022-12-24 MED ORDER — NA SULFATE-K SULFATE-MG SULF 17.5-3.13-1.6 GM/177ML PO SOLN
ORAL | 0 refills | Status: DC
  Filled 2022-12-24: qty 354, 1d supply, fill #0

## 2022-12-24 NOTE — Telephone Encounter (Signed)
[facility] Pre-operative Risk Assessment     Request for surgical clearance:     Endoscopy Procedure  What type of surgery is being performed?     Colonoscopy  When is this surgery scheduled?     02/04/23  What type of clearance is required ?   Pharmacy  Are there any medications that need to be held prior to surgery and how long? Eliquis 2 day's hold   Practice name and name of physician performing surgery?      [facility]  What is your office phone and fax number?      Phone- [phone]  Fax- [phone]  Anesthesia type (None, local, MAC, general) ?       MAC

## 2022-12-24 NOTE — Patient Instructions (Addendum)
Your provider has requested that you go to the basement level for lab work before leaving today. Press "B" on the elevator. The lab is located at the first door on the left as you exit the elevator.   You have been scheduled for a colonoscopy. Please follow written instructions given to you at your visit today.   Please pick up your prep supplies at the pharmacy within the next 1-3 days.  If you use inhalers (even only as needed), please bring them with you on the day of your procedure.  DO NOT TAKE 7 DAYS PRIOR TO TEST- Trulicity (dulaglutide) Ozempic, Wegovy (semaglutide) Mounjaro (tirzepatide) Bydureon Bcise (exanatide extended release)  DO NOT TAKE 1 DAY PRIOR TO YOUR TEST Rybelsus (semaglutide) Adlyxin (lixisenatide) Victoza (liraglutide) Byetta (exanatide) ___________________________________________________________________________  We have sent the following medications to your pharmacy for you to pick up at your convenience: Suprep  _______________________________________________________  If your blood pressure at your visit was 140/90 or greater, please contact your primary care physician to follow up on this.  _______________________________________________________  If you are age 53 or older, your body mass index should be between 23-30. Your Body mass index is 29.84 kg/m. If this is out of the aforementioned range listed, please consider follow up with your Primary Care Provider.  If you are age 53 or younger, your body mass index should be between 19-25. Your Body mass index is 29.84 kg/m. If this is out of the aformentioned range listed, please consider follow up with your Primary Care Provider.   ________________________________________________________  The [facility] providers would like to encourage you to use [facility] to communicate with providers for non-urgent requests or questions.  Due to long hold times on the telephone, sending your provider a message by  [facility] may be a faster and more efficient way to get a response.  Please allow 48 business hours for a response.  Please remember that this is for non-urgent requests.  _______________________________________________________  Due to recent changes in healthcare laws, you may see the results of your imaging and laboratory studies on MyChart before your provider has had a chance to review them.  We understand that in some cases there may be results that are confusing or concerning to you. Not all laboratory results come back in the same time frame and the provider may be waiting for multiple results in order to interpret others.  Please give [place] 48 hours in order for your provider to thoroughly review all the results before contacting the office for clarification of your results.   Thank you for entrusting me with your care and for choosing [facility], Dr. Eulah Pont

## 2022-12-24 NOTE — Progress Notes (Signed)
Chief Complaint: Colon cancer screening  HPI : 52 year old male with history of atrial fibrillation on Eliquis and DM presents to discuss colon cancer screening  He has never had a colonoscopy in the past. Denies blood in the stools, changes in his bowel habits, diarrhea, constipation, or unintentional weight loss. Denies N&V, dysphagia, or acid reflux. Denies family history of colon cancer. Earlier this year he did have issues with uncontrolled blood sugars and was diagnosed with atrial fibrillation. Now he is on Eliquis therapy for A-fib. He has increased his exercise regimen and improved his diet to help with his blood sugars. He drinks one alcoholic beverage per month. Denies chest pain or SOB. Denies prior abdominal surgeries.  Past Medical History:  Diagnosis Date   Allergy    Chicken pox    Hyperlipidemia    Seasonal allergies    Past Surgical History:  Procedure Laterality Date   BLEPHAROPLASTY  2010   left eye-lazy eye lid   CARDIOVERSION N/A 04/09/2022   Procedure: CARDIOVERSION;  Surgeon: Thomasene Ripple, DO;  Location: [facility] END[facility];  Service: Cardiovascular;  Laterality: N/A;   FINGER SURGERY     Dislocation 4th - Left   TEE WITHOUT CARDIOVERSION N/A 04/09/2022   Procedure: TRANSESOPHAGEAL ECHOCARDIOGRAM (TEE);  Surgeon: Thomasene Ripple, DO;  Location: [facility] ENDOSCOPY;  Service: Cardiovascular;  Laterality: N/A;   TOE SURGERY     Right 2nd - bone spur   WISDOM TOOTH EXTRACTION     Family History  Problem Relation Age of Onset   Arthritis Mother        Living   Prostate cancer Father        Living   Irritable bowel syndrome Brother    Crohn's disease Brother    Deep vein thrombosis Maternal Grandmother    Stroke Maternal Grandmother    Arthritis Paternal Grandmother    Alzheimer's disease Paternal Grandfather    Healthy Daughter    Colon cancer Neg Hx    Esophageal cancer Neg Hx    Stomach cancer Neg Hx    Social History   Tobacco Use   Smoking status: Former     Current packs/day: 0.00    Average packs/day: 0.3 packs/day for 1 year (0.3 ttl pk-yrs)    Types: Cigarettes    Start date: 07/12/1987    Quit date: 07/11/1988    Years since quitting: 34.4   Smokeless tobacco: Former    Types: Chew   Tobacco comments:    2. 5 years  Vaping Use   Vaping status: Never Used  Substance Use Topics   Alcohol use: Yes    Comment: rarely   Drug use: No   Current Outpatient Medications  Medication Sig Dispense Refill   apixaban (ELIQUIS) 5 MG TABS tablet Take 1 tablet (5 mg total) by mouth 2 (two) times daily. 180 tablet 1   cholecalciferol (VITAMIN D3) 25 MCG (1000 UNIT) tablet Take 1,000 Units by mouth daily.     Omega-3 Fatty Acids (FISH OIL) 1200 MG CAPS Take 2,400 mg by mouth daily.     losartan (COZAAR) 100 MG tablet Take 1 tablet (100 mg total) by mouth daily. (Patient not taking: Reported on 12/24/2022) 90 tablet 3   metFORMIN (GLUCOPHAGE) 500 MG tablet Take 1 tablet (500 mg total) by mouth 2 (two) times daily with a meal. (Patient not taking: Reported on 12/24/2022) 180 tablet 3   metoprolol succinate (TOPROL-XL) 25 MG 24 hr tablet Take 1 tablet (25 mg total) by  mouth daily. 30 tablet 0   SUPER B COMPLEX/C PO Take 1 capsule by mouth daily. (Patient not taking: Reported on 12/24/2022)     No current facility-administered medications for this visit.   Allergies  Allergen Reactions   June Grass Pollen Standardized [Timothy Grass Pollen Allergen]    Review of Systems: All systems reviewed and negative except where noted in HPI.   Physical Exam: BP 130/70   Pulse 64   Ht 5' 9.5" (1.765 m)   Wt 205 lb (93 kg)   BMI 29.84 kg/m  Constitutional: Pleasant,well-developed, male in no acute distress. HEENT: Normocephalic and atraumatic. Conjunctivae are normal. No scleral icterus. Cardiovascular: Irregularly irregular Pulmonary/chest: Effort normal and breath sounds normal. No wheezing, rales or rhonchi. Abdominal: Soft, nondistended, nontender. Bowel  sounds active throughout. There are no masses palpable. No hepatomegaly. Extremities: No edema Neurological: Alert and oriented to person place and time. Skin: Skin is warm and dry. No rashes noted. Psychiatric: Normal mood and affect. Behavior is normal.  Labs 02/2022: TSH nml. CMP with mildly elevated ALT of 51. HbA1C 8.6%  Labs 03/2022: CBC nml. BMP unremarkable.   ASSESSMENT AND PLAN: Colon cancer screening Elevated LFTs Patient presents to discuss getting a colonoscopy for colon cancer screening. He has never had a colonoscopy in the past. I went over the colonoscopy procedure in detail with the patient, and he is agreeable with proceeding. Patient had elevated ALT back in 02/2022 and has remote history of elevated LFTs several years ago. His elevated LFTs may be due to fatty liver. Will recheck his LFTs today.  - Check LFTs - Colonoscopy [facility]. Will need Eliquis for 2 days. Will contact his cardiologist Dr. Philis Fendt, MD  I spent 38 minutes of time, including in depth chart review, independent review of results as outlined above, communicating results with the patient directly, face-to-face time with the patient, coordinating care, ordering studies and medications as appropriate, and documentation.

## 2022-12-27 NOTE — Telephone Encounter (Signed)
Please advise holding Eliquis prior to colonoscopy on 12/13.  Thank you!  DW

## 2022-12-28 NOTE — Telephone Encounter (Addendum)
   Name: Gary Booth  DOB: [birth year]  MRN: [id number]  Primary Cardiologist: Jodelle Red, MD  Chart reviewed as part of pre-operative protocol coverage. Because of Gary Booth's past medical history and time since last visit, he will require a follow-up in-office visit in order to better assess preoperative cardiovascular risk.  Patient has an office visit scheduled on 01/12/2023 with Canary Brim, NP. Appointment notes have been updated to reflect need for pre-op evaluation.   Pre-op covering staff:  - Please contact requesting surgeon's office via preferred method (i.e, phone, fax) to inform them of need for appointment prior to surgery.  This message will also be routed to pharmacy pool for input on holding Eliquis as requested below so that this information is available to the clearing provider at time of patient's appointment.   Gary Levering, NP  12/28/2022, 10:57 AM

## 2022-12-28 NOTE — Telephone Encounter (Signed)
Patient with diagnosis of afib on Eliquis for anticoagulation.    Procedure: Colonoscopy  Date of procedure: 02/04/2023   CHA2DS2-VASc Score = 2   This indicates a 2.2% annual risk of stroke. The patient's score is based upon: CHF History: 0 HTN History: 1 Diabetes History: 1 Stroke History: 0 Vascular Disease History: 0 Age Score: 0 Gender Score: 0     CrCl >100 mL/min (04/08/2022) Platelet count 260 (04/08/2022)    Per office protocol, patient can hold Eliquis for 2 days prior to procedure.     **This guidance is not considered finalized until pre-operative APP has relayed final recommendations.**

## 2022-12-28 NOTE — Telephone Encounter (Signed)
I will update all parties involved pt has appt 01/12/23 with Canary Brim, NP.

## 2022-12-31 ENCOUNTER — Ambulatory Visit: Payer: Commercial Managed Care - PPO | Admitting: Cardiology

## 2023-01-11 NOTE — Progress Notes (Unsigned)
Electrophysiology Office Note:   Date:  01/12/2023  ID:  Gary Booth, DOB January 21, 1971, MRN [id number]  Primary Cardiologist: Jodelle Red, MD Electrophysiologist: Regan Lemming, MD      History of Present Illness:   Gary Booth is a 52 y.o. male with h/o paroxysmal AF, HTN seen today for cardiac clearance for colonoscopy.    Follows with Dr. Elberta Fortis for paroxysmal AF. Since last being seen in our clinic the patient reports he has been doing well. He describes being in a bad place in terms of his health in the early part of the year. He required a DCCV for AF.  He made changes to his diet and walks 6-8 miles daily.  He walks at after lunch, at work at [employer]  and after he is finished with work.  He has lost ~ 22lbs with diet and exercise. He reports he weaned himself off his metformin, losartan and toprol as his blood pressure and glucose improved with weight loss. He reports he has noticed in the last 3 months, he has noted a few episodes of palpitations / fluttering in his chest - largely occurring at night after work. He feels this may be related to dehydration and has increased his water intake.   He denies chest pain, palpitations, dyspnea, PND, orthopnea, nausea, vomiting, dizziness, syncope, edema, weight gain, or early satiety.  Review of systems complete and found to be negative unless listed in HPI.   EP Information / Studies Reviewed:    EKG is ordered today. Personal review as below.  EKG Interpretation Date/Time:  Wednesday January 12 2023 11:42:51 EST Ventricular Rate:  52 PR Interval:  136 QRS Duration:  88 QT Interval:  434 QTC Calculation: 403 R Axis:   93  Text Interpretation: Sinus bradycardia Confirmed by Canary Brim ([id number]) on 01/12/2023 12:14:59 PM   Studies:  ECHO 03/2022 > LVEF 55-60%, no RWMA, mild LVH  Arrhythmia / AAD Paroxysmal AF s/p TEE/DCCV 03/2022   Risk Assessment/Calculations:    CHA2DS2-VASc Score = 2   This  indicates a 2.2% annual risk of stroke. The patient's score is based upon: CHF History: 0 HTN History: 1 Diabetes History: 1 Stroke History: 0 Vascular Disease History: 0 Age Score: 0 Gender Score: 0         Physical Exam:   VS:  BP (!) 136/92   Pulse (!) 52   Ht 5\' 9"  (1.753 m)   Wt 205 lb (93 kg)   SpO2 97%   BMI 30.27 kg/m    Wt Readings from Last 3 Encounters:  01/12/23 205 lb (93 kg)  12/24/22 205 lb (93 kg)  05/07/22 217 lb (98.4 kg)     GEN: Well nourished, well developed in no acute distress NECK: No JVD; No carotid bruits CARDIAC: Regular rate and rhythm, no murmurs, rubs, gallops RESPIRATORY:  Clear to auscultation without rales, wheezing or rhonchi  ABDOMEN: Soft, non-tender, non-distended EXTREMITIES:  No edema; No deformity   ASSESSMENT AND PLAN:    Paroxysmal Atrial Fibrillation  CHA2DS2-VASc 2. S/p TEE, DCCV in 03/2022  -discussed restarting Toprol XL 25mg  daily  with patient due to palpitations / possible recurrent PAF and keeping note of symptoms to see if improved. Pt will consider restarting but hesitant at this time.   -OAC as below  -discussed concept of ablation with patient -reviewed natural continuum of AF with patient, & that despite proper diet / exercise, he may have recurrent AF  Secondary Hypercoagulable State  -  continue Eliquis 5mg  BID, dose appropriate by age/wt  -assess BMP, CBC with next visit   Hypertension  -controlled, 130/70-80's at home   Pre-Procedure Clearance   Procedure: Colonoscopy  Click Here to Calculate RCRI      :[id number]}   Gary Booth perioperative risk of a major cardiac event is 0.4% according to the Revised Cardiac Risk Index (RCRI).  Therefore, he is at low risk for perioperative complications.   His functional capacity is excellent at 9.89 METs according to the Duke Activity Status Index (DASI).  Recommendations: According to ACC/AHA guidelines, no further cardiovascular testing needed.  The patient  may proceed to surgery at acceptable risk.    Antiplatelet and/or Anticoagulation Recommendations:  Eliquis (Apixaban) can be held for 2 days prior to surgery.  Please resume post op when felt to be safe.    See prior review for [facility] in chart.     Follow up with Dr. Elberta Fortis or EP APP   6 months   Signed, Canary Brim, MSN, APRN, NP-C, AGACNP-BC [facility] - Electrophysiology  01/12/2023, 1:02 PM

## 2023-01-12 ENCOUNTER — Encounter: Payer: Self-pay | Admitting: Pulmonary Disease

## 2023-01-12 ENCOUNTER — Ambulatory Visit: Payer: Commercial Managed Care - PPO | Attending: Cardiology | Admitting: Pulmonary Disease

## 2023-01-12 VITALS — BP 136/92 | HR 52 | Ht 69.0 in | Wt 205.0 lb

## 2023-01-12 DIAGNOSIS — I10 Essential (primary) hypertension: Secondary | ICD-10-CM | POA: Diagnosis not present

## 2023-01-12 DIAGNOSIS — D6869 Other thrombophilia: Secondary | ICD-10-CM

## 2023-01-12 DIAGNOSIS — I480 Paroxysmal atrial fibrillation: Secondary | ICD-10-CM

## 2023-01-12 NOTE — Patient Instructions (Signed)
Follow-Up: At [facility], you and your health needs are our priority.  As part of our continuing mission to provide you with exceptional heart care, we have created designated Provider Care Teams.  These Care Teams include your primary Cardiologist (physician) and Advanced Practice Providers (APPs -  Physician Assistants and Nurse Practitioners) who all work together to provide you with the care you need, when you need it.  Your next appointment:   6 month(s)  Provider:   Loman Brooklyn, MD or Canary Brim, NP

## 2023-01-13 ENCOUNTER — Telehealth: Payer: Self-pay

## 2023-01-13 NOTE — Telephone Encounter (Signed)
Received message via Epic from cardiology okay to hold Eliquis 2 days prior to procedure.Called patient no answer left message on patient voice mail will attempt to call again.

## 2023-01-13 NOTE — Telephone Encounter (Signed)
Spoke to patient advised okay to hold Eliquis 2 days prior to procedure

## 2023-01-31 ENCOUNTER — Telehealth: Payer: Self-pay | Admitting: Internal Medicine

## 2023-01-31 NOTE — Telephone Encounter (Signed)
Patient called and stated that he has a colonoscopy procedure for December 13 and was wondering when he should stop taking Eliquis. Patient is requesting a call back. Please advise.

## 2023-01-31 NOTE — Telephone Encounter (Signed)
Returned pts call. Advised him to hold his Eliquis for 2 days prior to procedure.  His last dose should be 12/10.  Pt verbalized understanding.

## 2023-02-04 ENCOUNTER — Ambulatory Visit (AMBULATORY_SURGERY_CENTER): Payer: Commercial Managed Care - PPO | Admitting: Internal Medicine

## 2023-02-04 ENCOUNTER — Encounter: Payer: Self-pay | Admitting: Internal Medicine

## 2023-02-04 ENCOUNTER — Other Ambulatory Visit (HOSPITAL_COMMUNITY): Payer: Self-pay

## 2023-02-04 VITALS — BP 144/85 | HR 66 | Temp 98.1°F | Resp 11 | Ht 69.0 in | Wt 205.0 lb

## 2023-02-04 DIAGNOSIS — D123 Benign neoplasm of transverse colon: Secondary | ICD-10-CM

## 2023-02-04 DIAGNOSIS — K635 Polyp of colon: Secondary | ICD-10-CM

## 2023-02-04 DIAGNOSIS — Z1211 Encounter for screening for malignant neoplasm of colon: Secondary | ICD-10-CM

## 2023-02-04 DIAGNOSIS — K648 Other hemorrhoids: Secondary | ICD-10-CM | POA: Diagnosis not present

## 2023-02-04 MED ORDER — SODIUM CHLORIDE 0.9 % IV SOLN: 500.0000 mL | INTRAVENOUS | Status: DC

## 2023-02-04 NOTE — Progress Notes (Signed)
Sedate, gd SR, tolerated procedure well, VSS, report to RN 

## 2023-02-04 NOTE — Progress Notes (Signed)
Called to room to assist during endoscopic procedure.  Patient ID and intended procedure confirmed with present staff. Received instructions for my participation in the procedure from the performing physician.  

## 2023-02-04 NOTE — Progress Notes (Signed)
Pt's states no medical or surgical changes since previsit or office visit. 

## 2023-02-04 NOTE — Op Note (Addendum)
[facility] Patient Name: Gary Booth Procedure Date: 02/04/2023 1:10 PM MRN: [id number] Endoscopist: Particia Lather , , [id number] Age: 52 Referring MD:  Date of Birth: February 28, 1970 Gender: Male Account #: [id number] Procedure:                Colonoscopy Indications:              Screening for colorectal malignant neoplasm Medicines:                Monitored Anesthesia Care Procedure:                Pre-Anesthesia Assessment:                           - Prior to the procedure, a History and Physical                            was performed, and patient medications and                            allergies were reviewed. The patient's tolerance of                            previous anesthesia was also reviewed. The risks                            and benefits of the procedure and the sedation                            options and risks were discussed with the patient.                            All questions were answered, and informed consent                            was obtained. Prior Anticoagulants: The patient has                            taken Eliquis (apixaban), last dose was 3 days                            prior to procedure. ASA Grade Assessment: II - A                            patient with mild systemic disease. After reviewing                            the risks and benefits, the patient was deemed in                            satisfactory condition to undergo the procedure.                           After obtaining informed consent, the colonoscope  was passed under direct vision. Throughout the                            procedure, the patient's blood pressure, pulse, and                            oxygen saturations were monitored continuously. The                            Olympus Scope [id number] was introduced through the                            anus and advanced to the the terminal ileum. The                             colonoscopy was performed without difficulty. The                            patient tolerated the procedure well. The quality                            of the bowel preparation was good. The terminal                            ileum, ileocecal valve, appendiceal orifice, and                            rectum were photographed. Scope In: 1:21:01 PM Scope Out: 1:44:41 PM Scope Withdrawal Time: 0 hours 19 minutes 42 seconds  Total Procedure Duration: 0 hours 23 minutes 40 seconds  Findings:                 The terminal ileum appeared normal.                           A 5 mm polyp was found in the ascending colon. The                            polyp was sessile. The polyp was removed with a                            cold snare. Resection was complete, but the polyp                            tissue was not retrieved.                           Non-bleeding internal hemorrhoids were found during                            retroflexion. Complications:            No immediate complications. Estimated Blood Loss:     Estimated blood loss was minimal. Impression:               -  The examined portion of the ileum was normal.                           - One 5 mm polyp in the ascending colon, removed                            with a cold snare. Complete resection. Polyp tissue                            not retrieved.                           - Non-bleeding internal hemorrhoids. Recommendation:           - Discharge patient to home (with escort).                           - Polyp tissue was not able to retrieved due to                            seeds interfering with scope suction.                           - Repeat colonoscopy in 5 years for surveillance.                           - Okay to restart Eliquis tomorrow.                           - The findings and recommendations were discussed                            with the patient. Dr Particia Lather "Alan Ripper" Leonides Schanz,   02/04/2023 1:50:22 PM

## 2023-02-04 NOTE — Progress Notes (Signed)
GASTROENTEROLOGY PROCEDURE H&P NOTE   Primary Care Physician: Esperanza Richters, PA-C    Reason for Procedure:   Colon cancer screening  Plan:    Colonoscopy  Patient is appropriate for endoscopic procedure(s) in the ambulatory ([facility]) setting.  The nature of the procedure, as well as the risks, benefits, and alternatives were carefully and thoroughly reviewed with the patient. Ample time for discussion and questions allowed. The patient understood, was satisfied, and agreed to proceed.     HPI: Gary Booth is a 52 y.o. male who presents for colonoscopy for evaluation of colon cancer screening .  Patient was most recently seen in the Gastroenterology Clinic on 12/24/22.  No interval change in medical history since that appointment. Please refer to that note for full details regarding GI history and clinical presentation.   Past Medical History:  Diagnosis Date   Allergy    Chicken pox    Hyperlipidemia    Seasonal allergies     Past Surgical History:  Procedure Laterality Date   BLEPHAROPLASTY  2010   left eye-lazy eye lid   CARDIOVERSION N/A 04/09/2022   Procedure: CARDIOVERSION;  Surgeon: Thomasene Ripple, DO;  Location: [facility];  Service: Cardiovascular;  Laterality: N/A;   FINGER SURGERY     Dislocation 4th - Left   TEE WITHOUT CARDIOVERSION N/A 04/09/2022   Procedure: TRANSESOPHAGEAL ECHOCARDIOGRAM (TEE);  Surgeon: Thomasene Ripple, DO;  Location: [facility];  Service: Cardiovascular;  Laterality: N/A;   TOE SURGERY     Right 2nd - bone spur   WISDOM TOOTH EXTRACTION      Prior to Admission medications   Medication Sig Start Date End Date Taking? Authorizing Provider  cholecalciferol (VITAMIN D3) 25 MCG (1000 UNIT) tablet Take 1,000 Units by mouth daily.   Yes [provider]  apixaban (ELIQUIS) 5 MG TABS tablet Take 1 tablet (5 mg total) by mouth 2 (two) times daily. 08/17/22   Camnitz, Andree Coss, MD    Current Outpatient Medications  Medication  Sig Dispense Refill   cholecalciferol (VITAMIN D3) 25 MCG (1000 UNIT) tablet Take 1,000 Units by mouth daily.     apixaban (ELIQUIS) 5 MG TABS tablet Take 1 tablet (5 mg total) by mouth 2 (two) times daily. 180 tablet 1   Current Facility-Administered Medications  Medication Dose Route Frequency Provider Last Rate Last Admin   0.9 %  sodium chloride infusion  500 mL Intravenous Continuous Imogene Burn, MD        Allergies as of 02/04/2023 - Review Complete 02/04/2023  Allergen Reaction Noted   June grass pollen standardized [timothy grass pollen allergen]  06/17/2020    Family History  Problem Relation Age of Onset   Arthritis Mother        Living   Prostate cancer Father        Living   Irritable bowel syndrome Brother    Crohn's disease Brother    Deep vein thrombosis Maternal Grandmother    Stroke Maternal Grandmother    Arthritis Paternal Grandmother    Alzheimer's disease Paternal Grandfather    Healthy Daughter    Colon cancer Neg Hx    Esophageal cancer Neg Hx    Stomach cancer Neg Hx     Social History   Socioeconomic History   Marital status: Married    Spouse name: Not on file   Number of children: 1   Years of education: Not on file   Highest education level: Not on file  Occupational  History   Occupation: Leisure centre manager  Tobacco Use   Smoking status: Former    Current packs/day: 0.00    Average packs/day: 0.3 packs/day for 1 year (0.3 ttl pk-yrs)    Types: Cigarettes    Start date: 07/12/1987    Quit date: 07/11/1988    Years since quitting: 34.5   Smokeless tobacco: Former    Types: Chew   Tobacco comments:    2. 5 years  Vaping Use   Vaping status: Never Used  Substance and Sexual Activity   Alcohol use: Yes    Comment: rarely   Drug use: No   Sexual activity: Yes  Other Topics Concern   Not on file  Social History Narrative   Not on file   Social Drivers of Health   Financial Resource Strain: Not on file  Food Insecurity: Not on file   Transportation Needs: Not on file  Physical Activity: Not on file  Stress: Not on file  Social Connections: Not on file  Intimate Partner Violence: Not on file    Physical Exam: Vital signs in last 24 hours: BP (!) 160/80   Pulse 60   Temp 98.1 F (36.7 C) (Temporal)   Resp 13   Ht 5\' 9"  (1.753 m)   Wt 205 lb (93 kg)   SpO2 96%   BMI 30.27 kg/m  GEN: NAD EYE: Sclerae anicteric ENT: MMM CV: Non-tachycardic Pulm: No increased WOB GI: Soft NEURO:  Alert & Oriented   Eulah Pont, MD [facility]   02/04/2023 1:18 PM

## 2023-02-04 NOTE — Patient Instructions (Addendum)
Handout provided about hemorrhoids and polyps.  Repeat colonoscopy in 5 years for surveillance.  Restart Eliquis tomorrow. Verbal order per Dr. Leonides Schanz  YOU HAD AN ENDOSCOPIC PROCEDURE TODAY AT THE [facility]:   Refer to the procedure report that was given to you for any specific questions about what was found during the examination.  If the procedure report does not answer your questions, please call your gastroenterologist to clarify.  If you requested that your care partner not be given the details of your procedure findings, then the procedure report has been included in a sealed envelope for you to review at your convenience later.  YOU SHOULD EXPECT: Some feelings of bloating in the abdomen. Passage of more gas than usual.  Walking can help get rid of the air that was put into your GI tract during the procedure and reduce the bloating. If you had a lower endoscopy (such as a colonoscopy or flexible sigmoidoscopy) you may notice spotting of blood in your stool or on the toilet paper. If you underwent a bowel prep for your procedure, you may not have a normal bowel movement for a few days.  Please Note:  You might notice some irritation and congestion in your nose or some drainage.  This is from the oxygen used during your procedure.  There is no need for concern and it should clear up in a day or so.  SYMPTOMS TO REPORT IMMEDIATELY:  Following lower endoscopy (colonoscopy or flexible sigmoidoscopy):  Excessive amounts of blood in the stool  Significant tenderness or worsening of abdominal pains  Swelling of the abdomen that is new, acute  Fever of 100F or higher   For urgent or emergent issues, a gastroenterologist can be reached at any hour by calling [phone]. Do not use MyChart messaging for urgent concerns.    DIET:  We do recommend a small meal at first, but then you may proceed to your regular diet.  Drink plenty of fluids but you should avoid alcoholic  beverages for 24 hours.  ACTIVITY:  You should plan to take it easy for the rest of today and you should NOT DRIVE or use heavy machinery until tomorrow (because of the sedation medicines used during the test).    FOLLOW UP: Our staff will call the number listed on your records the next business day following your procedure.  We will call around 7:15- 8:00 am to check on you and address any questions or concerns that you may have regarding the information given to you following your procedure. If we do not reach you, we will leave a message.     If any biopsies were taken you will be contacted by phone or by letter within the next 1-3 weeks.  Please call us at [phone] if you have not heard about the biopsies in 3 weeks.    SIGNATURES/CONFIDENTIALITY: You and/or your care partner have signed paperwork which will be entered into your electronic medical record.  These signatures attest to the fact that that the information above on your After Visit Summary has been reviewed and is understood.  Full responsibility of the confidentiality of this discharge information lies with you and/or your care-partner.

## 2023-02-07 ENCOUNTER — Telehealth: Payer: Self-pay

## 2023-02-07 NOTE — Telephone Encounter (Signed)
  Follow up Call-     02/04/2023   12:42 PM  Call back number  Post procedure Call Back phone  # [phone]  Permission to leave phone message Yes     Patient questions:  Do you have a fever, pain , or abdominal swelling? No. Pain Score  0 *  Have you tolerated food without any problems? Yes.    Have you been able to return to your normal activities? Yes.    Do you have any questions about your discharge instructions: Diet   No. Medications  No. Follow up visit  No.  Do you have questions or concerns about your Care? No.  Actions: * If pain score is 4 or above: No action needed, pain <4.

## 2023-03-09 ENCOUNTER — Other Ambulatory Visit (HOSPITAL_BASED_OUTPATIENT_CLINIC_OR_DEPARTMENT_OTHER): Payer: Self-pay | Admitting: Cardiology

## 2023-03-09 DIAGNOSIS — I480 Paroxysmal atrial fibrillation: Secondary | ICD-10-CM

## 2023-03-10 ENCOUNTER — Other Ambulatory Visit (HOSPITAL_COMMUNITY): Payer: Self-pay

## 2023-03-10 ENCOUNTER — Other Ambulatory Visit (HOSPITAL_BASED_OUTPATIENT_CLINIC_OR_DEPARTMENT_OTHER): Payer: Self-pay

## 2023-03-10 MED ORDER — APIXABAN 5 MG PO TABS
5.0000 mg | ORAL_TABLET | Freq: Two times a day (BID) | ORAL | 1 refills | Status: DC
  Filled 2023-03-10: qty 60, 30d supply, fill #0
  Filled 2023-04-06: qty 60, 30d supply, fill #1
  Filled 2023-05-16: qty 60, 30d supply, fill #2
  Filled 2023-06-19: qty 60, 30d supply, fill #3
  Filled 2023-07-18: qty 60, 30d supply, fill #4
  Filled 2023-08-14 – 2023-08-15 (×2): qty 60, 30d supply, fill #5

## 2023-03-10 NOTE — Telephone Encounter (Signed)
Prescription refill request for Eliquis received. Indication: Afib  Last office visit: 01/12/23 Veleta Miners)  Scr: 0.85 (04/08/22)  Age: 53 Weight: 93kg  Appropriate dose. Refill sent.

## 2023-04-28 ENCOUNTER — Ambulatory Visit (HOSPITAL_BASED_OUTPATIENT_CLINIC_OR_DEPARTMENT_OTHER): Payer: Commercial Managed Care - PPO | Admitting: Cardiology

## 2023-04-28 ENCOUNTER — Encounter (HOSPITAL_BASED_OUTPATIENT_CLINIC_OR_DEPARTMENT_OTHER): Payer: Self-pay | Admitting: Cardiology

## 2023-04-28 VITALS — BP 130/80 | HR 50 | Ht 69.0 in | Wt 213.6 lb

## 2023-04-28 DIAGNOSIS — E119 Type 2 diabetes mellitus without complications: Secondary | ICD-10-CM

## 2023-04-28 DIAGNOSIS — D6869 Other thrombophilia: Secondary | ICD-10-CM

## 2023-04-28 DIAGNOSIS — R002 Palpitations: Secondary | ICD-10-CM

## 2023-04-28 DIAGNOSIS — Z7901 Long term (current) use of anticoagulants: Secondary | ICD-10-CM | POA: Diagnosis not present

## 2023-04-28 DIAGNOSIS — Z7189 Other specified counseling: Secondary | ICD-10-CM

## 2023-04-28 DIAGNOSIS — I10 Essential (primary) hypertension: Secondary | ICD-10-CM

## 2023-04-28 DIAGNOSIS — I480 Paroxysmal atrial fibrillation: Secondary | ICD-10-CM | POA: Diagnosis not present

## 2023-04-28 NOTE — Patient Instructions (Signed)
Medication Instructions:  Your physician recommends that you continue on your current medications as directed. Please refer to the Current Medication list given to you today.  *If you need a refill on your cardiac medications before your next appointment, please call your pharmacy*   Follow-Up: At [facility], you and your health needs are our priority.  As part of our continuing mission to provide you with exceptional heart care, we have created designated Provider Care Teams.  These Care Teams include your primary Cardiologist (physician) and Advanced Practice Providers (APPs -  Physician Assistants and Nurse Practitioners) who all work together to provide you with the care you need, when you need it.  We recommend signing up for the patient portal called "MyChart".  Sign up information is provided on this After Visit Summary.  MyChart is used to connect with patients for Virtual Visits (Telemedicine).  Patients are able to view lab/test results, encounter notes, upcoming appointments, etc.  Non-urgent messages can be sent to your provider as well.   To learn more about what you can do with MyChart, go to [email or web address].    Your next appointment:   1 year   Provider:   Jodelle Red, MD

## 2023-04-28 NOTE — Progress Notes (Signed)
 Cardiology Office Note:  .   Date:  04/28/2023  ID:  Elita Boone, DOB [birth year], MRN [id number] PCP: Marisue Brooklyn  [facility] Providers Cardiologist:  Jodelle Red, MD Electrophysiologist:  Will Jorja Loa, MD {  History of Present Illness: .   Gary Booth is a 53 y.o. male with a hx of atrial fibrillation, hypertension, type II diabetes who is seen for follow-up today. He was initially seen 03/28/2020 as a new consult at the request of Saguier, Ramon Dredge, PA-C for the evaluation and management of palpitations   Today: Has been trying to find patterns to what helps his symptoms. He notes that hydration and food patterns affect his palpitations. Has to stay away from salt.  He notes that taking eliquis on an empty stomach worsened his symptoms, which have improved when he takes the PM dose closer to dinner.   Seen by Dr. Elberta Fortis 05/07/2022. At that time, he was not ready to consider ablation.  Wife reports that he has been very meticulous about monitoring his symptoms, BP, sugars, etc at home. Has a smartwatch that takes ECGs. These have never shown an abnormal rhythm while he is having symptoms. We discussed potential etiologies for symptoms  BP at home 120s-130/80s routinely.  We discussed autonomic nervous system/tone at length. Drinks a little coffee in the morning, no other stimulants. He feels best in the morning, worse later in the day.  ROS: Denies chest pain, shortness of breath at rest or with normal exertion. No PND, orthopnea, LE edema or unexpected weight gain. No syncope or palpitations. ROS otherwise negative except as noted.   Studies Reviewed: Marland Kitchen    EKG:       Physical Exam:   VS:  BP 130/80 Comment: home  Pulse (!) 50   Ht 5\' 9"  (1.753 m)   Wt 213 lb 9.6 oz (96.9 kg)   SpO2 98%   BMI 31.54 kg/m    Wt Readings from Last 3 Encounters:  04/28/23 213 lb 9.6 oz (96.9 kg)  02/04/23 205 lb (93 kg)  01/12/23 205 lb (93 kg)     GEN: Well nourished, well developed in no acute distress HEENT: Normal, moist mucous membranes NECK: No JVD CARDIAC: regular rhythm, normal S1 and S2, no rubs or gallops. No murmur. VASCULAR: Radial and DP pulses 2+ bilaterally. No carotid bruits RESPIRATORY:  Clear to auscultation without rales, wheezing or rhonchi  ABDOMEN: Soft, non-tender, non-distended MUSCULOSKELETAL:  Ambulates independently SKIN: Warm and dry, no edema NEUROLOGIC:  Alert and oriented x 3. No focal neuro deficits noted. PSYCHIATRIC:  Normal affect    ASSESSMENT AND PLAN: .    Palpitations -we had extensive conversation today about different causes/triggers/affecting factors of palpitations. I personally reviewed several ECG captures during symptoms. Most of these were NSR, a few with SR with occasional PVC -he and his wife note that he is meticulous regarding pattern. Discussed that if his symptoms are not afib, ablation unlikely to help at this time. Discussed potential non-cardiac etiologies for symptoms. Discussed role of sympathetic vs vagal tone on rhythm/rate, potential sensitivity of symptoms.  Atrial fibrillation, paroxysmal -s/p TEE-CV, has remained in SR since. Feels much better in sinus rhythm -CHA2DS2/VAS Stroke Risk Points= 2 (HTN, DM) -discussed pros/cons, risks of anticoagulation today. After shared decision making, he will continue. No bleeding issues.    Hypertension:  -he stopped his BP meds, reports home BP readings are normal, elevated in office today initially but improved on recheck -discussed goal <  130/80 -he will continue to monitor, will follow up with PCP   Type II diabetes: -last A1c a year ago was 8.6, due for PCP visit -no longer on metformin -no aspirin as he is on apixaban -wants to avoid medication, declines statin  CV risk counseling and prevention -recommend heart healthy/Mediterranean diet, with whole grains, fruits, vegetable, fish, lean meats, nuts, and olive oil. Limit  salt. -recommend moderate walking, 3-5 times/week for 30-50 minutes each session. Aim for at least 150 minutes.week. Goal should be pace of 3 miles/hours, or walking 1.5 miles in 30 minutes -recommend avoidance of tobacco products. Avoid excess alcohol. -ASCVD risk score: The 10-year ASCVD risk score (Arnett DK, et al., 2019) is: 7.7%   Values used to calculate the score:     Age: 60 years     Sex: Male     Is Non-Hispanic African American: No     Diabetic: Yes     Tobacco smoker: No     Systolic Blood Pressure: 130 mmHg     Is BP treated: No     HDL Cholesterol: 35 mg/dL     Total Cholesterol: 134 mg/dL    Dispo: 1 year or sooner as needed  Total time of encounter: I spent 44 minutes dedicated to the care of this patient on the date of this encounter to include pre-visit review of records, face-to-face time with the patient discussing conditions above, and clinical documentation with the electronic health record. We specifically spent time today discussing palpitations, patterns, potential causes/triggers. Also discussed hypertension, diabetes monitoring and recommendations. Discussed pros/cons of anticoagulation.   Signed, Jodelle Red, MD   Jodelle Red, MD, PhD, [facility]  [facility]  [facility] at [facility] [address] [place], [place] [address] [phone]

## 2023-05-13 ENCOUNTER — Other Ambulatory Visit (HOSPITAL_BASED_OUTPATIENT_CLINIC_OR_DEPARTMENT_OTHER): Payer: Self-pay

## 2023-05-13 ENCOUNTER — Encounter (HOSPITAL_BASED_OUTPATIENT_CLINIC_OR_DEPARTMENT_OTHER): Payer: Self-pay

## 2023-05-13 DIAGNOSIS — I10 Essential (primary) hypertension: Secondary | ICD-10-CM

## 2023-05-13 MED ORDER — LOSARTAN POTASSIUM 100 MG PO TABS
100.0000 mg | ORAL_TABLET | Freq: Every day | ORAL | 1 refills | Status: DC
  Filled 2023-05-13: qty 30, 30d supply, fill #0

## 2023-05-17 ENCOUNTER — Other Ambulatory Visit (HOSPITAL_COMMUNITY): Payer: Self-pay

## 2023-05-24 ENCOUNTER — Other Ambulatory Visit: Payer: Self-pay

## 2023-05-24 DIAGNOSIS — I10 Essential (primary) hypertension: Secondary | ICD-10-CM

## 2023-05-24 MED ORDER — LOSARTAN POTASSIUM 100 MG PO TABS: 100.0000 mg | ORAL_TABLET | Freq: Every day | ORAL | 3 refills | Status: AC

## 2023-06-21 ENCOUNTER — Ambulatory Visit (HOSPITAL_BASED_OUTPATIENT_CLINIC_OR_DEPARTMENT_OTHER)
Admission: RE | Admit: 2023-06-21 | Discharge: 2023-06-21 | Disposition: A | Discharge status: Home / Self Care | Source: Ambulatory Visit | Attending: Medical | Admitting: Medical

## 2023-06-21 ENCOUNTER — Encounter: Payer: Self-pay | Admitting: Medical

## 2023-06-21 ENCOUNTER — Ambulatory Visit: Admitting: Medical

## 2023-06-21 VITALS — BP 114/66 | HR 82 | Temp 98.8°F | Resp 18 | Ht 69.0 in | Wt 206.0 lb

## 2023-06-21 DIAGNOSIS — R002 Palpitations: Secondary | ICD-10-CM

## 2023-06-21 DIAGNOSIS — R059 Cough, unspecified: Secondary | ICD-10-CM

## 2023-06-21 DIAGNOSIS — Z125 Encounter for screening for malignant neoplasm of prostate: Secondary | ICD-10-CM | POA: Diagnosis not present

## 2023-06-21 DIAGNOSIS — M2550 Pain in unspecified joint: Secondary | ICD-10-CM | POA: Diagnosis not present

## 2023-06-21 DIAGNOSIS — R5383 Other fatigue: Secondary | ICD-10-CM

## 2023-06-21 DIAGNOSIS — R509 Fever, unspecified: Secondary | ICD-10-CM

## 2023-06-21 DIAGNOSIS — E119 Type 2 diabetes mellitus without complications: Secondary | ICD-10-CM

## 2023-06-21 DIAGNOSIS — R0981 Nasal congestion: Secondary | ICD-10-CM

## 2023-06-21 LAB — COMPREHENSIVE METABOLIC PANEL WITH GFR
ALT: 39 U/L (ref 0–53)
AST: 30 U/L (ref 0–37)
Albumin: 4.5 g/dL (ref 3.5–5.2)
Alkaline Phosphatase: 67 U/L (ref 39–117)
BUN: 14 mg/dL (ref 6–23)
CO2: 29 meq/L (ref 19–32)
Calcium: 8.7 mg/dL (ref 8.4–10.5)
Chloride: 97 meq/L (ref 96–112)
Creatinine, Ser: 0.93 mg/dL (ref 0.40–1.50)
GFR: 93.41 mL/min (ref >60.00)
Glucose, Bld: 134 mg/dL — ABNORMAL HIGH (ref 70–99)
Potassium: 4.1 meq/L (ref 3.5–5.1)
Sodium: 134 meq/L — ABNORMAL LOW (ref 135–145)
Total Bilirubin: 0.9 mg/dL (ref 0.2–1.2)
Total Protein: 6.9 g/dL (ref 6.0–8.3)

## 2023-06-21 LAB — C-REACTIVE PROTEIN: CRP: 6.7 mg/dL (ref 0.5–20.0)

## 2023-06-21 LAB — CBC WITH DIFFERENTIAL/PLATELET
Basophils Absolute: 0 10*3/uL (ref 0.0–0.1)
Basophils Relative: 0.3 % (ref 0.0–3.0)
Eosinophils Absolute: 0.1 10*3/uL (ref 0.0–0.7)
Eosinophils Relative: 0.7 % (ref 0.0–5.0)
HCT: 48.4 % (ref 39.0–52.0)
Hemoglobin: 16.4 g/dL (ref 13.0–17.0)
Lymphocytes Relative: 8.3 % — ABNORMAL LOW (ref 12.0–46.0)
Lymphs Abs: 0.8 10*3/uL (ref 0.7–4.0)
MCHC: 34 g/dL (ref 30.0–36.0)
MCV: 91.4 fl (ref 78.0–100.0)
Monocytes Absolute: 0.6 10*3/uL (ref 0.1–1.0)
Monocytes Relative: 6.4 % (ref 3.0–12.0)
Neutro Abs: 8.2 10*3/uL — ABNORMAL HIGH (ref 1.4–7.7)
Neutrophils Relative %: 84.3 % — ABNORMAL HIGH (ref 43.0–77.0)
Platelets: 219 10*3/uL (ref 150.0–400.0)
RBC: 5.3 Mil/uL (ref 4.22–5.81)
RDW: 13 % (ref 11.5–15.5)
WBC: 9.7 10*3/uL (ref 4.0–10.5)

## 2023-06-21 LAB — PSA: PSA: 0.39 ng/mL (ref 0.10–4.00)

## 2023-06-21 LAB — SEDIMENTATION RATE: Sed Rate: 5 mm/h (ref 0–20)

## 2023-06-21 LAB — TSH: TSH: 0.53 u[IU]/mL (ref 0.35–5.50)

## 2023-06-21 LAB — POCT INFLUENZA A/B
Influenza A, POC: NEGATIVE
Influenza B, POC: NEGATIVE

## 2023-06-21 NOTE — Patient Instructions (Addendum)
 1. Fever, fatigue and random rare occasional cough -need to to do work up to determine possible cause. - CBC w/Diff - Comp Met (CMET) - DG Chest 2 View; Future - POCT Influenza A/B(negative) -Repeat covid test tomorrow afternoon to make sure initial test was not falsely negative. -no tick bite history and on careful inspection no tick found. Advise to check groin buttock area at home and rescan body. If any tick found let me know and would order tick bite studies.  2. Fatigue, unspecified type - Comp Met (CMET) - TSH - DG Chest 2 View; Future -cbc   3. Arthralgia, unspecified joint - Rheumatoid factor - ANA - C-reactive protein - Sedimentation rate  4. Palpitation -sinus rythm. No acute ischemic changes. Compared to prior EKG and very similar/no signiicant changes. - EKG 12-Lead   5. Cough, unspecified type Random but with fever and fatigue xray to rule out walking pneumonia. - DG Chest 2 View; Future  6. Nasal congestion -minimal no sinus pressure on exam. Advise flonase otc for possible allergic rhinitis   7. Screening for prostate cancer(if any frequent urination let me know.) in that event need to get UA. - PSA   Follow-up date to be determined after lab and imaging review.  Asked patient to update me on new or changing signs and symptoms.  Reinspect skin for any visible ticks.  If any check seen notify me and we will get that tick bite antibody studies.  Not indicated at present.  If any seen notify me.  Retest for COVID tomorrow afternoon.  If positive notify me.

## 2023-06-21 NOTE — Progress Notes (Signed)
 Subjective:    Patient ID: Gary Booth, male    DOB: 1970-12-27, 53 y.o.   MRN: [id number]  HPI  Patient in today reporting moderate fatigue with low-grade fever Tmax 100 and very random occasional cough occurring maybe 2-3 times over the last 3 days.  On review patient does not report any sinus pain ,ear pain ,sore throat skin rash, urinary frequency, headache, neck stiffness, abdomen pain or diarrhea.  Also on discussion no tick bites.  No nausea or vomiting.  Patient did note that along with low-grade fever he transient bilateral hip pain and ankle pain early in the mornings.  He does feel progressively a little better since Sunday when the symptoms came on.  Joint pains are not present during exam.  Patient COVID test was negative yesterday.  A flu test was negative today.  Patient notes that his pulse has been 80-90 most of the time.  Last night he described mild transient palpitations but no chest pain.         Review of Systems  Constitutional:  Positive for fatigue and fever. Negative for chills.  HENT:  Positive for congestion.        Other that he felt minimal faint nasal congestion.  Respiratory:  Positive for cough. Negative for choking, shortness of breath and wheezing.        Random very rare mild cough.  Cardiovascular:  Negative for chest pain and palpitations.  Gastrointestinal:  Negative for abdominal pain and diarrhea.  Genitourinary:  Negative for dysuria, frequency, penile pain, testicular pain and urgency.  Musculoskeletal:  Negative for back pain, myalgias and neck stiffness.       Hips and ankle pain in am.  Skin:  Negative for rash.  Neurological:  Negative for dizziness and headaches.  Hematological:  Negative for adenopathy.  Psychiatric/Behavioral:  Negative for behavioral problems, decreased concentration and sleep disturbance. The patient is not nervous/anxious.     Past Medical History:  Diagnosis Date   Allergy    Chicken pox     Hyperlipidemia    Seasonal allergies      Social History   Socioeconomic History   Marital status: Married    Spouse name: Not on file   Number of children: 1   Years of education: Not on file   Highest education level: Not on file  Occupational History   Occupation: flight safety  Tobacco Use   Smoking status: Former    Current packs/day: 0.00    Average packs/day: 0.3 packs/day for 1 year (0.3 ttl pk-yrs)    Types: Cigarettes    Start date: 07/12/1987    Quit date: 07/11/1988    Years since quitting: 34.9   Smokeless tobacco: Former    Types: Chew   Tobacco comments:    2. 5 years  Vaping Use   Vaping status: Never Used  Substance and Sexual Activity   Alcohol use: Yes    Comment: rarely   Drug use: No   Sexual activity: Yes  Other Topics Concern   Not on file  Social History Narrative   Not on file   Social Drivers of Health   Financial Resource Strain: Not on file  Food Insecurity: Not on file  Transportation Needs: Not on file  Physical Activity: Not on file  Stress: Not on file  Social Connections: Not on file  Intimate Partner Violence: Not on file    Past Surgical History:  Procedure Laterality Date   BLEPHAROPLASTY  2010  left eye-lazy eye lid   CARDIOVERSION N/A 04/09/2022   Procedure: CARDIOVERSION;  Surgeon: Jerryl Morin, DO;  Location: [facility];  Service: Cardiovascular;  Laterality: N/A;   FINGER SURGERY     Dislocation 4th - Left   TEE WITHOUT CARDIOVERSION N/A 04/09/2022   Procedure: TRANSESOPHAGEAL ECHOCARDIOGRAM (TEE);  Surgeon: Jerryl Morin, DO;  Location: [facility];  Service: Cardiovascular;  Laterality: N/A;   TOE SURGERY     Right 2nd - bone spur   WISDOM TOOTH EXTRACTION      Family History  Problem Relation Age of Onset   Arthritis Mother        Living   Prostate cancer Father        Living   Irritable bowel syndrome Brother    Crohn's disease Brother    Deep vein thrombosis Maternal Grandmother    Stroke Maternal  Grandmother    Arthritis Paternal Grandmother    Alzheimer's disease Paternal Grandfather    Healthy Daughter    Colon cancer Neg Hx    Esophageal cancer Neg Hx    Stomach cancer Neg Hx     Allergies  Allergen Reactions   June Grass Pollen Standardized [Timothy Grass Pollen Allergen]     Current Outpatient Medications on File Prior to Visit  Medication Sig Dispense Refill   apixaban  (ELIQUIS ) 5 MG TABS tablet Take 1 tablet (5 mg total) by mouth 2 (two) times daily. 180 tablet 1   losartan  (COZAAR ) 100 MG tablet Take 1 tablet (100 mg total) by mouth daily. 90 tablet 3   Misc Natural Products (BEET ROOT PO) Take 8,000 mg by mouth 2 (two) times daily.     No current facility-administered medications on file prior to visit.    BP 114/66   Pulse 82   Temp 98.8 F (37.1 C)   Resp 18   Ht 5\' 9"  (1.753 m)   Wt 206 lb (93.4 kg)   SpO2 96%   BMI 30.42 kg/m        Objective:   Physical Exam  General Mental Status- Alert. General Appearance- Not in acute distress.   Skin -Close inspection of anterior thorax, posterior thorax arms and legs show no rash or ticks  Neck . No JVD.  No lymphadenopathy of the neck or submandibular nodes.  Chest and Lung Exam Auscultation: Breath Sounds:-cta  Cardiovascular Auscultation:Rythm- RRR Murmurs & Other Heart Sounds:Auscultation of the heart reveals- No Murmurs.  Abdomen Inspection:-Inspeection Normal. Palpation/Percussion:Note:No mass. Palpation and Percussion of the abdomen reveal- Non Tender, Non Distended + BS, no rebound or guarding.   Neurologic Cranial Nerve exam:- CN III-XII intact(No nystagmus), symmetric smile. Strength:- 5/5 equal and symmetric strength both upper and lower extremities.   Heent- mild nasal congestion no boggy turbinates.  Canals clear normal TMs.  Posterior pharynx is normal.  No redness, tonsillar hypertrophy or enlarged uvula. No sinus pressure    Assessment & Plan:   Patient Instructions  1.  Fever, fatigue and random rare occasional cough -need to to do work up to determine possible cause. - CBC w/Diff - Comp Met (CMET) - DG Chest 2 View; Future - POCT Influenza A/B(negative) -Repeat covid test tomorrow afternoon to make sure initial test was not falsely negative. -no tick bite history and on careful inspection no tick found. Advise to check groin buttock area at home and rescan body. If any tick found let me know and would order tick bite studies.  2. Fatigue, unspecified type - Comp Met (CMET) -  TSH - DG Chest 2 View; Future -cbc   3. Arthralgia, unspecified joint - Rheumatoid factor - ANA - C-reactive protein - Sedimentation rate  4. Palpitation -sinus rythm. No acute ischemic changes. Compared to prior EKG and very similar/no signiicant changes. - EKG 12-Lead   5. Cough, unspecified type Random but with fever and fatigue xray to rule out walking pneumonia. - DG Chest 2 View; Future  6. Nasal congestion -minimal no sinus pressure on exam. Advise flonase otc for possible allergic rhinitis   7. Screening for prostate cancer(if any frequent urination let me know.) in that event need to get UA. - PSA   Follow-up date to be determined after lab and imaging review.  Asked patient to update me on new or changing signs and symptoms.  Reinspect skin for any visible ticks.  If any check seen notify me and we will get that tick bite antibody studies.  Not indicated at present.  If any seen notify me.  Retest for COVID tomorrow afternoon.  If positive notify me.   Time spent with patient today was  45 minutes which consisted of chart review, discussing diagnosis, work up, treatment and documentation.

## 2023-06-22 ENCOUNTER — Encounter: Payer: Self-pay | Admitting: Medical

## 2023-06-22 LAB — ANA: Anti Nuclear Antibody (ANA): NEGATIVE

## 2023-06-22 LAB — RHEUMATOID FACTOR: Rheumatoid fact SerPl-aCnc: <10 [IU]/mL (ref <14)

## 2023-06-22 NOTE — Addendum Note (Signed)
 Addended by: Serafina Damme on: 06/22/2023 06:14 AM   Modules accepted: Orders

## 2023-06-23 ENCOUNTER — Encounter: Payer: Self-pay | Admitting: Medical

## 2023-06-23 ENCOUNTER — Ambulatory Visit

## 2023-06-23 DIAGNOSIS — E119 Type 2 diabetes mellitus without complications: Secondary | ICD-10-CM

## 2023-06-23 LAB — HEMOGLOBIN A1C: Hgb A1c MFr Bld: 6.3 % (ref 4.6–6.5)

## 2023-06-23 NOTE — Addendum Note (Signed)
 Addended by: Serafina Damme on: 06/23/2023 11:57 AM   Modules accepted: Orders

## 2023-08-15 ENCOUNTER — Other Ambulatory Visit (HOSPITAL_COMMUNITY): Payer: Self-pay

## 2023-09-12 ENCOUNTER — Other Ambulatory Visit (HOSPITAL_COMMUNITY): Payer: Self-pay

## 2023-09-12 ENCOUNTER — Other Ambulatory Visit (HOSPITAL_BASED_OUTPATIENT_CLINIC_OR_DEPARTMENT_OTHER): Payer: Self-pay | Admitting: Cardiology

## 2023-09-12 DIAGNOSIS — I480 Paroxysmal atrial fibrillation: Secondary | ICD-10-CM

## 2023-09-12 MED ORDER — APIXABAN 5 MG PO TABS
5.0000 mg | ORAL_TABLET | Freq: Two times a day (BID) | ORAL | 1 refills | Status: DC
  Filled 2023-09-12: qty 60, 30d supply, fill #0
  Filled 2023-10-13: qty 60, 30d supply, fill #1
  Filled 2023-11-11: qty 60, 30d supply, fill #2
  Filled 2023-12-20: qty 60, 30d supply, fill #3
  Filled 2024-01-17: qty 60, 30d supply, fill #4
  Filled 2024-02-27: qty 60, 30d supply, fill #5

## 2023-09-12 NOTE — Telephone Encounter (Signed)
 Eliquis  5mg  refill request received. Patient is 53 years old, weight-93.4kg, Crea-0.93 on 06/21/23, Diagnosis-Afib, and last seen by Dr. Lonni on 04/28/23. Dose is appropriate based on dosing criteria. Will send in refill to requested pharmacy.

## 2023-09-22 NOTE — Progress Notes (Unsigned)
  Electrophysiology Office Note:   Date:  09/23/2023  ID:  Gary Booth, DOB [birth year], MRN [id number]  Primary Cardiologist: Shelda Bruckner, MD Electrophysiologist: Will Gladis Norton, MD   Electrophysiologist:  Soyla Gladis Norton, MD      History of Present Illness:   Gary Booth is a 53 y.o. male with h/o paroxysmal AF and HTN  seen today for routine electrophysiology followup.   Since last being seen in our clinic the patient reports doing very well. Occasional light palpitations but nothing sustained. Overall,   he denies chest pain, palpitations, dyspnea, PND, orthopnea, nausea, vomiting, dizziness, syncope, edema, weight gain, or early satiety.   Review of systems complete and found to be negative unless listed in HPI.   EP Information / Studies Reviewed:    EKG is not ordered today. EKG from 06/21/2023 reviewed which showed sinus bradycardia        Arrhythmia/Device History No specialty comments available.   Physical Exam:   VS:  BP (!) 140/86   Pulse 87   Ht 5' 9 (1.753 m)   Wt 207 lb (93.9 kg)   SpO2 95%   BMI 30.57 kg/m    Wt Readings from Last 3 Encounters:  09/23/23 207 lb (93.9 kg)  06/21/23 206 lb (93.4 kg)  04/28/23 213 lb 9.6 oz (96.9 kg)     GEN: No acute distress NECK: No JVD; No carotid bruits CARDIAC: Regular rate and rhythm, no murmurs, rubs, gallops RESPIRATORY:  Clear to auscultation without rales, wheezing or rhonchi  ABDOMEN: Soft, non-tender, non-distended EXTREMITIES:  No edema; No deformity   ASSESSMENT AND PLAN:    Paroxysmal AF Seldom recurrence by symptoms Continue eliquis  5 mg BID for CHA2DS2VASc of at least 2 Discussed possibility of loop recorder for monitoring with improvement of his A1c. He prefers to continue OAC for now.   Secondary hypercoagulable state Pt on Eliquis  as above   HTN Stable on current regimen   DM2 Not on meds, report HGbA1c normalized with diet and exercise.   Follow up with EP  Team in 12 months  Signed, Ozell Prentice Passey, PA-C

## 2023-09-23 ENCOUNTER — Encounter: Payer: Self-pay | Admitting: Student

## 2023-09-23 ENCOUNTER — Ambulatory Visit: Attending: Student | Admitting: Student

## 2023-09-23 VITALS — BP 140/86 | HR 87 | Ht 69.0 in | Wt 207.0 lb

## 2023-09-23 DIAGNOSIS — I10 Essential (primary) hypertension: Secondary | ICD-10-CM

## 2023-09-23 DIAGNOSIS — I480 Paroxysmal atrial fibrillation: Secondary | ICD-10-CM | POA: Diagnosis not present

## 2023-09-23 DIAGNOSIS — D6869 Other thrombophilia: Secondary | ICD-10-CM | POA: Diagnosis not present

## 2023-09-23 NOTE — Patient Instructions (Signed)
 Medication Instructions:  Your physician recommends that you continue on your current medications as directed. Please refer to the Current Medication list given to you today.  *If you need a refill on your cardiac medications before your next appointment, please call your pharmacy*  Lab Work: None ordered If you have labs (blood work) drawn today and your tests are completely normal, you will receive your results only by: MyChart Message (if you have MyChart) OR A paper copy in the mail If you have any lab test that is abnormal or we need to change your treatment, we will call you to review the results.  Follow-Up: At [facility], you and your health needs are our priority.  As part of our continuing mission to provide you with exceptional heart care, our providers are all part of one team.  This team includes your primary Cardiologist (physician) and Advanced Practice Providers or APPs (Physician Assistants and Nurse Practitioners) who all work together to provide you with the care you need, when you need it.  Your next appointment:   1 year(s)  Provider:   You may see Will Cortland Ding, MD or one of the following Advanced Practice Providers on your designated Care Team:   Mertha Abrahams, PA-C Michael Andy Tillery, PA-C Suzann Riddle, NP Creighton Doffing, NP

## 2023-10-17 ENCOUNTER — Other Ambulatory Visit (HOSPITAL_COMMUNITY): Payer: Self-pay

## 2024-03-23 ENCOUNTER — Other Ambulatory Visit (HOSPITAL_BASED_OUTPATIENT_CLINIC_OR_DEPARTMENT_OTHER): Payer: Self-pay | Admitting: Cardiology

## 2024-03-23 ENCOUNTER — Other Ambulatory Visit (HOSPITAL_COMMUNITY): Payer: Self-pay

## 2024-03-23 DIAGNOSIS — I480 Paroxysmal atrial fibrillation: Secondary | ICD-10-CM

## 2024-03-23 MED ORDER — APIXABAN 5 MG PO TABS
5.0000 mg | ORAL_TABLET | Freq: Two times a day (BID) | ORAL | 1 refills | Status: AC
  Filled 2024-03-23: qty 60, 30d supply, fill #0

## 2024-03-23 NOTE — Telephone Encounter (Signed)
 Eliquis  5mg  refill request received. Patient is 54 years old, weight-93.9kg, Crea-0.93 on 06/21/23, Diagnosis-Afib, and last seen by Jodie Passey on 09/23/23. Dose is appropriate based on dosing criteria. Will send in refill to requested pharmacy.
